# Patient Record
Sex: Female | Born: 1975 | Race: White | Hispanic: No | Smoking: Current every day smoker
Health system: Southern US, Community
[De-identification: ages and names within clinical notes are randomized; demographics above are authoritative.]

## PROBLEM LIST (undated history)

## (undated) DIAGNOSIS — T50901A Poisoning by unspecified drugs, medicaments and biological substances, accidental (unintentional), initial encounter: Secondary | ICD-10-CM

## (undated) DIAGNOSIS — N281 Cyst of kidney, acquired: Secondary | ICD-10-CM

## (undated) DIAGNOSIS — F329 Major depressive disorder, single episode, unspecified: Secondary | ICD-10-CM

## (undated) DIAGNOSIS — E079 Disorder of thyroid, unspecified: Secondary | ICD-10-CM

## (undated) DIAGNOSIS — F32A Depression, unspecified: Secondary | ICD-10-CM

## (undated) DIAGNOSIS — X838XXA Intentional self-harm by other specified means, initial encounter: Secondary | ICD-10-CM

## (undated) HISTORY — PX: ABDOMINAL HYSTERECTOMY: SHX81

## (undated) HISTORY — PX: OVARIAN CYST REMOVAL: SHX89

---

## 2001-06-10 ENCOUNTER — Other Ambulatory Visit: Admission: RE | Admit: 2001-06-10 | Discharge: 2001-06-10 | Payer: Self-pay | Admitting: *Deleted

## 2001-07-08 ENCOUNTER — Encounter: Payer: Self-pay | Admitting: Emergency Medicine

## 2001-07-08 ENCOUNTER — Emergency Department (HOSPITAL_COMMUNITY): Admission: EM | Admit: 2001-07-08 | Discharge: 2001-07-09 | Payer: Self-pay | Admitting: Emergency Medicine

## 2001-12-19 ENCOUNTER — Emergency Department (HOSPITAL_COMMUNITY): Admission: EM | Admit: 2001-12-19 | Discharge: 2001-12-19 | Payer: Self-pay | Admitting: Emergency Medicine

## 2002-04-17 ENCOUNTER — Encounter: Payer: Self-pay | Admitting: Emergency Medicine

## 2002-04-17 ENCOUNTER — Emergency Department (HOSPITAL_COMMUNITY): Admission: EM | Admit: 2002-04-17 | Discharge: 2002-04-17 | Payer: Self-pay | Admitting: Emergency Medicine

## 2002-06-04 ENCOUNTER — Emergency Department (HOSPITAL_COMMUNITY): Admission: EM | Admit: 2002-06-04 | Discharge: 2002-06-04 | Payer: Self-pay | Admitting: Emergency Medicine

## 2002-06-16 ENCOUNTER — Emergency Department (HOSPITAL_COMMUNITY): Admission: EM | Admit: 2002-06-16 | Discharge: 2002-06-16 | Payer: Self-pay | Admitting: Internal Medicine

## 2002-10-16 ENCOUNTER — Emergency Department (HOSPITAL_COMMUNITY): Admission: EM | Admit: 2002-10-16 | Discharge: 2002-10-16 | Payer: Self-pay | Admitting: Emergency Medicine

## 2002-12-23 ENCOUNTER — Emergency Department (HOSPITAL_COMMUNITY): Admission: EM | Admit: 2002-12-23 | Discharge: 2002-12-23 | Payer: Self-pay | Admitting: Emergency Medicine

## 2003-02-02 ENCOUNTER — Emergency Department (HOSPITAL_COMMUNITY): Admission: EM | Admit: 2003-02-02 | Discharge: 2003-02-02 | Payer: Self-pay | Admitting: Emergency Medicine

## 2003-06-15 ENCOUNTER — Emergency Department (HOSPITAL_COMMUNITY): Admission: EM | Admit: 2003-06-15 | Discharge: 2003-06-15 | Payer: Self-pay | Admitting: Emergency Medicine

## 2003-07-03 ENCOUNTER — Encounter: Payer: Self-pay | Admitting: *Deleted

## 2003-07-03 ENCOUNTER — Ambulatory Visit (HOSPITAL_COMMUNITY): Admission: RE | Admit: 2003-07-03 | Discharge: 2003-07-03 | Payer: Self-pay | Admitting: *Deleted

## 2003-10-25 ENCOUNTER — Emergency Department (HOSPITAL_COMMUNITY): Admission: EM | Admit: 2003-10-25 | Discharge: 2003-10-25 | Payer: Self-pay | Admitting: Emergency Medicine

## 2003-12-22 ENCOUNTER — Emergency Department (HOSPITAL_COMMUNITY): Admission: EM | Admit: 2003-12-22 | Discharge: 2003-12-22 | Payer: Self-pay | Admitting: Emergency Medicine

## 2004-10-04 ENCOUNTER — Observation Stay (HOSPITAL_COMMUNITY): Admission: RE | Admit: 2004-10-04 | Discharge: 2004-10-04 | Payer: Self-pay | Admitting: General Surgery

## 2004-10-23 ENCOUNTER — Ambulatory Visit (HOSPITAL_COMMUNITY): Admission: RE | Admit: 2004-10-23 | Discharge: 2004-10-23 | Payer: Self-pay | Admitting: *Deleted

## 2004-11-29 ENCOUNTER — Observation Stay (HOSPITAL_COMMUNITY): Admission: RE | Admit: 2004-11-29 | Discharge: 2004-11-30 | Payer: Self-pay | Admitting: *Deleted

## 2005-07-01 ENCOUNTER — Ambulatory Visit: Payer: Self-pay | Admitting: Internal Medicine

## 2005-07-01 ENCOUNTER — Inpatient Hospital Stay (HOSPITAL_COMMUNITY): Admission: EM | Admit: 2005-07-01 | Discharge: 2005-07-04 | Payer: Self-pay | Admitting: Emergency Medicine

## 2005-07-24 ENCOUNTER — Emergency Department (HOSPITAL_COMMUNITY): Admission: EM | Admit: 2005-07-24 | Discharge: 2005-07-25 | Payer: Self-pay | Admitting: Emergency Medicine

## 2006-02-04 ENCOUNTER — Emergency Department (HOSPITAL_COMMUNITY): Admission: EM | Admit: 2006-02-04 | Discharge: 2006-02-04 | Payer: Self-pay | Admitting: Emergency Medicine

## 2006-09-02 ENCOUNTER — Emergency Department (HOSPITAL_COMMUNITY): Admission: EM | Admit: 2006-09-02 | Discharge: 2006-09-02 | Payer: Self-pay | Admitting: Emergency Medicine

## 2006-10-26 ENCOUNTER — Emergency Department (HOSPITAL_COMMUNITY): Admission: EM | Admit: 2006-10-26 | Discharge: 2006-10-26 | Payer: Self-pay | Admitting: Emergency Medicine

## 2006-11-26 ENCOUNTER — Emergency Department (HOSPITAL_COMMUNITY): Admission: EM | Admit: 2006-11-26 | Discharge: 2006-11-26 | Payer: Self-pay | Admitting: Emergency Medicine

## 2007-01-24 ENCOUNTER — Ambulatory Visit (HOSPITAL_COMMUNITY): Admission: AD | Admit: 2007-01-24 | Discharge: 2007-01-24 | Payer: Self-pay | Admitting: Obstetrics & Gynecology

## 2007-05-05 ENCOUNTER — Inpatient Hospital Stay (HOSPITAL_COMMUNITY): Admission: AD | Admit: 2007-05-05 | Discharge: 2007-05-05 | Payer: Self-pay | Admitting: Obstetrics & Gynecology

## 2007-05-06 ENCOUNTER — Encounter: Payer: Self-pay | Admitting: Obstetrics & Gynecology

## 2007-05-06 ENCOUNTER — Inpatient Hospital Stay (HOSPITAL_COMMUNITY): Admission: RE | Admit: 2007-05-06 | Discharge: 2007-05-08 | Payer: Self-pay | Admitting: Obstetrics & Gynecology

## 2007-08-07 ENCOUNTER — Emergency Department (HOSPITAL_COMMUNITY): Admission: EM | Admit: 2007-08-07 | Discharge: 2007-08-07 | Payer: Self-pay | Admitting: Emergency Medicine

## 2007-08-09 ENCOUNTER — Ambulatory Visit (HOSPITAL_COMMUNITY): Admission: RE | Admit: 2007-08-09 | Discharge: 2007-08-09 | Payer: Self-pay | Admitting: Emergency Medicine

## 2007-08-09 ENCOUNTER — Inpatient Hospital Stay (HOSPITAL_COMMUNITY): Admission: EM | Admit: 2007-08-09 | Discharge: 2007-08-11 | Payer: Self-pay | Admitting: Emergency Medicine

## 2007-10-24 ENCOUNTER — Emergency Department (HOSPITAL_COMMUNITY): Admission: EM | Admit: 2007-10-24 | Discharge: 2007-10-24 | Payer: Self-pay | Admitting: Emergency Medicine

## 2008-04-10 ENCOUNTER — Ambulatory Visit (HOSPITAL_COMMUNITY): Admission: RE | Admit: 2008-04-10 | Discharge: 2008-04-10 | Payer: Self-pay | Admitting: Obstetrics and Gynecology

## 2008-06-23 ENCOUNTER — Encounter: Payer: Self-pay | Admitting: Obstetrics & Gynecology

## 2008-06-23 ENCOUNTER — Inpatient Hospital Stay (HOSPITAL_COMMUNITY): Admission: RE | Admit: 2008-06-23 | Discharge: 2008-06-26 | Payer: Self-pay | Admitting: Obstetrics & Gynecology

## 2008-06-30 ENCOUNTER — Encounter (HOSPITAL_COMMUNITY): Admission: RE | Admit: 2008-06-30 | Discharge: 2008-07-17 | Payer: Self-pay | Admitting: Obstetrics and Gynecology

## 2008-07-19 ENCOUNTER — Emergency Department (HOSPITAL_COMMUNITY): Admission: EM | Admit: 2008-07-19 | Discharge: 2008-07-19 | Payer: Self-pay | Admitting: Emergency Medicine

## 2009-01-09 ENCOUNTER — Emergency Department (HOSPITAL_COMMUNITY): Admission: EM | Admit: 2009-01-09 | Discharge: 2009-01-09 | Payer: Self-pay | Admitting: Emergency Medicine

## 2009-01-11 ENCOUNTER — Emergency Department (HOSPITAL_COMMUNITY): Admission: EM | Admit: 2009-01-11 | Discharge: 2009-01-11 | Payer: Self-pay | Admitting: Emergency Medicine

## 2009-02-09 ENCOUNTER — Emergency Department (HOSPITAL_COMMUNITY): Admission: EM | Admit: 2009-02-09 | Discharge: 2009-02-09 | Payer: Self-pay | Admitting: Emergency Medicine

## 2009-03-06 ENCOUNTER — Emergency Department: Payer: Self-pay

## 2009-04-28 ENCOUNTER — Emergency Department (HOSPITAL_COMMUNITY): Admission: EM | Admit: 2009-04-28 | Discharge: 2009-04-28 | Payer: Self-pay | Admitting: Emergency Medicine

## 2009-12-11 ENCOUNTER — Emergency Department (HOSPITAL_COMMUNITY): Admission: EM | Admit: 2009-12-11 | Discharge: 2009-12-11 | Payer: Self-pay | Admitting: Emergency Medicine

## 2010-03-25 ENCOUNTER — Emergency Department (HOSPITAL_COMMUNITY): Admission: EM | Admit: 2010-03-25 | Discharge: 2010-03-25 | Payer: Self-pay | Admitting: Emergency Medicine

## 2010-08-04 ENCOUNTER — Emergency Department (HOSPITAL_COMMUNITY): Admission: EM | Admit: 2010-08-04 | Discharge: 2010-08-04 | Payer: Self-pay | Admitting: Emergency Medicine

## 2010-08-31 ENCOUNTER — Emergency Department (HOSPITAL_COMMUNITY): Admission: EM | Admit: 2010-08-31 | Discharge: 2010-08-31 | Payer: Self-pay | Admitting: Emergency Medicine

## 2010-11-03 ENCOUNTER — Emergency Department (HOSPITAL_COMMUNITY)
Admission: EM | Admit: 2010-11-03 | Discharge: 2010-11-03 | Payer: Self-pay | Source: Home / Self Care | Admitting: Emergency Medicine

## 2010-11-04 LAB — URINALYSIS, ROUTINE W REFLEX MICROSCOPIC
Bilirubin Urine: NEGATIVE
Hgb urine dipstick: NEGATIVE
Ketones, ur: NEGATIVE mg/dL
Nitrite: NEGATIVE
Protein, ur: NEGATIVE mg/dL
Specific Gravity, Urine: 1.025 (ref 1.005–1.030)
Urine Glucose, Fasting: NEGATIVE mg/dL
Urobilinogen, UA: 0.2 mg/dL (ref 0.0–1.0)
pH: 5.5 (ref 5.0–8.0)

## 2010-11-10 ENCOUNTER — Encounter: Payer: Self-pay | Admitting: *Deleted

## 2010-11-10 ENCOUNTER — Encounter: Payer: Self-pay | Admitting: Obstetrics and Gynecology

## 2010-11-11 ENCOUNTER — Encounter: Payer: Self-pay | Admitting: Obstetrics and Gynecology

## 2010-12-25 ENCOUNTER — Other Ambulatory Visit (HOSPITAL_COMMUNITY): Payer: Self-pay | Admitting: Family Medicine

## 2010-12-25 ENCOUNTER — Ambulatory Visit (HOSPITAL_COMMUNITY)
Admission: RE | Admit: 2010-12-25 | Discharge: 2010-12-25 | Disposition: A | Discharge status: Home / Self Care | Payer: Medicaid Other | Source: Ambulatory Visit | Attending: Family Medicine | Admitting: Family Medicine

## 2010-12-25 DIAGNOSIS — M25519 Pain in unspecified shoulder: Secondary | ICD-10-CM | POA: Insufficient documentation

## 2010-12-25 DIAGNOSIS — R52 Pain, unspecified: Secondary | ICD-10-CM

## 2010-12-25 DIAGNOSIS — M545 Low back pain, unspecified: Secondary | ICD-10-CM | POA: Insufficient documentation

## 2010-12-31 LAB — URINE MICROSCOPIC-ADD ON

## 2010-12-31 LAB — URINALYSIS, ROUTINE W REFLEX MICROSCOPIC
Glucose, UA: NEGATIVE mg/dL
Ketones, ur: NEGATIVE mg/dL
pH: 8.5 — ABNORMAL HIGH (ref 5.0–8.0)

## 2011-01-01 LAB — URINALYSIS, ROUTINE W REFLEX MICROSCOPIC
Bilirubin Urine: NEGATIVE
Ketones, ur: NEGATIVE mg/dL
Nitrite: NEGATIVE
Specific Gravity, Urine: >1.03 — ABNORMAL HIGH (ref 1.005–1.030)
Urobilinogen, UA: 0.2 mg/dL (ref 0.0–1.0)

## 2011-01-01 LAB — URINE CULTURE
Colony Count: 75000
Culture  Setup Time: 201110172038

## 2011-01-01 LAB — URINE MICROSCOPIC-ADD ON

## 2011-01-26 LAB — URINE CULTURE

## 2011-01-26 LAB — URINALYSIS, ROUTINE W REFLEX MICROSCOPIC
Bilirubin Urine: NEGATIVE
Protein, ur: 100 mg/dL — AB
Urobilinogen, UA: 0.2 mg/dL (ref 0.0–1.0)

## 2011-03-04 ENCOUNTER — Emergency Department (HOSPITAL_COMMUNITY)
Admission: EM | Admit: 2011-03-04 | Discharge: 2011-03-04 | Disposition: A | Discharge status: Home / Self Care | Payer: Medicaid Other | Attending: Emergency Medicine | Admitting: Emergency Medicine

## 2011-03-04 DIAGNOSIS — M4120 Other idiopathic scoliosis, site unspecified: Secondary | ICD-10-CM | POA: Insufficient documentation

## 2011-03-04 DIAGNOSIS — W19XXXA Unspecified fall, initial encounter: Secondary | ICD-10-CM | POA: Insufficient documentation

## 2011-03-04 DIAGNOSIS — IMO0001 Reserved for inherently not codable concepts without codable children: Secondary | ICD-10-CM | POA: Insufficient documentation

## 2011-03-04 DIAGNOSIS — G2581 Restless legs syndrome: Secondary | ICD-10-CM | POA: Insufficient documentation

## 2011-03-04 DIAGNOSIS — Z79899 Other long term (current) drug therapy: Secondary | ICD-10-CM | POA: Insufficient documentation

## 2011-03-04 DIAGNOSIS — S335XXA Sprain of ligaments of lumbar spine, initial encounter: Secondary | ICD-10-CM | POA: Insufficient documentation

## 2011-03-04 DIAGNOSIS — F319 Bipolar disorder, unspecified: Secondary | ICD-10-CM | POA: Insufficient documentation

## 2011-03-04 NOTE — H&P (Signed)
[name], [name]                ACCOUNT NO.:  [id number]   MEDICAL RECORD NO.:  [id number]          PATIENT TYPE:  INP   LOCATION:  NA                            FACILITY:  [facility]   PHYSICIAN:  Lazaro Arms, M.D.   DATE OF BIRTH:  1976-10-17   DATE OF ADMISSION:  05/06/2007  DATE OF DISCHARGE:                              HISTORY & PHYSICAL   HISTORY OF PRESENT ILLNESS:  The patient is a 35 year old white female,  Gravida 3, para 1, abortus 1, estimated delivery of 05/08/07, currently  at 39 weeks and five days gestation, who is admitted for a repeat  cesarean section.  She had a cesarean section back in 1998 for toxemia.  She declines a trial of labor.  She had an elective abortion in 07/07.  The patient's pregnancy has been complicated by two issues, number one  she is a chronic narcotic uses, came into the pregnancy using  benzodiazepines, narcotics, had a positive drug screen on her initial  visit, positive for cocaine, marijuana, and benzodiazepines.  She has  subsequently had negative urine drug screens for cocaine.  I basically  bargained with her and she has been taking throughout the pregnancy, no  more than two Vicodin per day and she has stuck with this and on an  occasion or two she has been problematic but overall she has done well.   Additionally she has had two episodes of boles in her vulva and lower  abdomen and they were positive for methicillin resistant Staph. aureus.  The only thing that she could take in pregnancy and it was sensitive to  was Zyvox which she has had twice in the pregnancy, 600 mg twice a day.  She recently finished a course of that and her lesions have all dried  up.  Otherwise besides that the pregnancy has been otherwise  uncomplicated.  She is admitted for a repeat cesarean section.   PAST MEDICAL HISTORY:  History of drug abuse, narcotic abuse, and  tobacco abuse.   PAST SURGICAL HISTORY:  Cesarean section in 1998, had an ovarian cyst  surgery, and she states surgery for adhesions times two.   ALLERGIES:  Allergies are to sulfa drugs which cause itching and  burning.   CURRENT MEDICATIONS:  Her current medications are prenatal vitamins and  Vicodin, she takes two a day.  She recently finished a course of Zyvox  for positive methicillin resistant Staph. aureus.   REVIEW OF SYSTEMS:  The review of systems is otherwise negative.   PHYSICAL EXAMINATION:  HEENT:  Unremarkable.  NECK:  The thyroid is normal.  LUNGS:  The lungs are clear.  HEART:  The heart is regular rhythm without regurgitation or gallop.  BREASTS:  Deferred.  ABDOMEN:  Gravid, fundal height of 36 cm, cervix is fingertip, thick,  and high, vertex.   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  Pap was ascus and colpo was negative.  GC  and  chlamydia are negative times two.  AFP was normal.  Group B strep  was negative.  Glucola was normal.   IMPRESSION:  1. Intrauterine pregnancy at 49 and 5/7ths weeks gestation.  2. Previous cesarean section.  3. Declines trial of labor.  4. Positive methicillin resistant Staph. aureus.  5. History of narcotic abuse and cocaine abuse.   PLAN:  The patient is admitted for a repeat cesarean section.  She  understands the risks, benefits, indications, and alternatives, will  proceed.      Lazaro Arms, M.D.  Electronically Signed     LHE/MEDQ  D:  05/05/2007  T:  05/05/2007  Job:  [id number]   cc:   Lazaro Arms, M.D.  Fax: [phone]

## 2011-03-04 NOTE — Op Note (Signed)
[name], [name]                ACCOUNT NO.:  [id number]   MEDICAL RECORD NO.:  [id number]          PATIENT TYPE:  INP   LOCATION:  9103                          FACILITY:  [facility]   PHYSICIAN:  Lazaro Arms, M.D.   DATE OF BIRTH:  11/24/1975   DATE OF PROCEDURE:  05/06/2007  DATE OF DISCHARGE:                               OPERATIVE REPORT   PREOPERATIVE DIAGNOSES:  1. Intrauterine pregnancy at 56 and 5/7th weeks gestation.  2. Previous cesarean section.  3. Declines trial of labor.  4. History of narcotic dependence.   POSTOPERATIVE DIAGNOSES:  1. Intrauterine pregnancy at 34 and 5/7th weeks gestation.  2. Previous cesarean section.  3. Declines trial of labor.  4. History of narcotic dependence.   PROCEDURE:  Repeat cesarean section.   SURGEON:  Lazaro Arms, MD.   ANESTHESIA:  Spinal.   FINDINGS:  Over a low transverse hysterotomy incision was delivered a  viable female infant with Apgar's of 9 and 9 with weight determined in  the nursery.  There was a three-vessel cord.  Cord blood and cord gas  were sent.  Placenta was normal and intact and sent to Pathology as well  routinely.  Uterus, tubes, and ovaries were all normal.   DESCRIPTION OF OPERATION:  The patient was taken to the operating room  and underwent a spinal anesthetic.  She was prepped and draped in the  usual sterile fashion, and a Foley catheter was placed.  A Pfannenstiel  skin incision was made, carried down sharply to the rectus fascia,  scored in the midline, and extended laterally.  The fascia was taken off  the muscles superiorly and inferiorly without difficulty, the muscles  were divided, the peritoneal cavity was entered.  A large Alexis wound  retractor was placed as a self-retaining retractor, the bladder flap was  created.  A low transverse hysterotomy incision was made and over this  incision was delivered a viable female infant with Apgar's of 9 and 9  with the weight to be determined in  the nursery.  There was a three-  vessel cord.  Cord blood and cord gases were sent.  The placenta was  delivered spontaneously.  The uterus was exteriorized, closed in two  layers, the first being a running interlocking layer, the second being  an imbricating layer that was also locked.  There was good hemostasis.  The uterus was replaced into the peritoneal cavity, both pericolic  gutters were irrigated, all pedicles were found to be hemostatic.  The  muscles and peritoneum reapproximated loosely, the fascia closed using  #0 Vicryl running, the subcutaneous tissue was made hemostatic and  irrigated, and  the skin was closed with skin staples.  The patient tolerated the  procedure well.  She experienced 500 ml of blood loss and was taken to  Recovery in good, stable condition.  All counts were correct x3.  She  received Cleocin prophylactically due to a history of MRSA.      Lazaro Arms, M.D.  Electronically Signed     LHE/MEDQ  D:  05/06/2007  T:  05/06/2007  Job:  [id number]

## 2011-03-04 NOTE — Discharge Summary (Signed)
NAMEKATHLEN, [name]                ACCOUNT NO.:  [id number]   MEDICAL RECORD NO.:  [id number]          PATIENT TYPE:  INP   LOCATION:  A332                          FACILITY:  [facility]   PHYSICIAN:  Marcello Moores, MD   DATE OF BIRTH:  October 11, 1976   DATE OF ADMISSION:  08/09/2007  DATE OF DISCHARGE:  10/22/2008LH                               DISCHARGE SUMMARY   PRIMARY MEDICAL DOCTOR:  Unassigned.   DISCHARGING DIAGNOSES:  1. Urinary tract infection, questionable pyelonephritis, resolving.  2. Right and left ovarian cyst.  She will have follow-up with      gynecologist Dr. Emelda Fear in the coming 2 to 3 days.   HOME MEDICATIONS:  She will be discharged with:  1. Levaquin 500 mg p.o. daily for 7 days.  2. Vicodin 1 tablet p.o. every 6 hours p.r.n.  3. Xanax 0.25 mg twice a day.  4. Cymbalta 60 mg p.o. daily   HOSPITAL COURSE:  Ms. [name] is a 35 year old female patient who came  with abdominal pain more in the lower part of the abdomen and right side  involving the lower back area, and she had urinalysis positive for  urinary tract infection, and she has slight leukocytosis, and she had  low-grade fever, and she was admitted with urinary tract infection,  possible pyelonephritis.  Urine culture as well as blood culture showed  negative, and ultrasound of the pelvic area also done and showed right  ovarian cyst  2 x 9 x 1.9 x 1.7 cm and left ovarian cyst 1.2 x 1.2 x 1.5  cm, and the patient was put on Levaquin and pain medication, and her  pain slightly improved, and she is urging to go home today, and she is  stable except she has mild pain on the left lower side of the abdomen.  CAT scan of the abdomen was also done, and it showed no active pelvic  findings except right ovarian cyst, and the patient is going to be  discharged with the Levaquin, and she was advised to come back if there  is any concern, any pain, any fever, and she was advised to have follow-  up with gynecologist  and will try to have appointment for her while she  was here before she was discharged, and she agreed that she will see Dr.  Emelda Fear in the coming 2 to 3 days for her ovarian cyst as the pain  could be also from the ovarian cyst.   Today, her vital signs are:  Temperature is 97.9 and pulse is 78,  respiratory rate 20, blood pressure is 105/63, and she has pink  conjunctivae, nonicteric sclera.  NECK:  Is supple.  CHEST:  Good air entry.  CARDIOVASCULAR SYSTEM:  S1/S2 regular.  ABDOMEN:  Soft.  Normoactive bowel sounds.  Slight tenderness on the  right lower quadrant.  No guarding, no rigidity.  EXTREMITIES:  No pedal edema.  CENTRAL NERVOUS SYSTEM:  She is alert and well oriented.   LABORATORY DATA:  Today, CBC is 11.3, hemoglobin is 10.5, hematocrit 31  and platelet count is 306.  On the chemistry, sodium is 141, potassium  is 3.5, chloride is 107, bicarb 27, glucose is 105, BUN 8, creatinine  0.7, and urine culture as well as blood culture is negative.   PLAN ON DISCHARGE:  Will be discharged with Levaquin and Vicodin for her  pain, and she will be followed with gynecologist in the coming 2 to 3  days, and she and her husband understood and were willing to do so.      Marcello Moores, MD  Electronically Signed     MT/MEDQ  D:  08/11/2007  T:  08/12/2007  Job:  [id number]

## 2011-03-04 NOTE — Op Note (Signed)
[name], [name]                ACCOUNT NO.:  [id number]   MEDICAL RECORD NO.:  [id number]          PATIENT TYPE:  INP   LOCATION:  9104                          FACILITY:  [facility]   PHYSICIAN:  Lazaro Arms, M.D.   DATE OF BIRTH:  1976/08/24   DATE OF PROCEDURE:  06/23/2008  DATE OF DISCHARGE:                               OPERATIVE REPORT   PREOPERATIVE DIAGNOSES:  1. Term intrauterine pregnancy.  2. Undesired fertility.  3. History of prior cesarean section.   POSTOPERATIVE DIAGNOSES:  1. Term intrauterine pregnancy.  2. Undesired fertility.  3. History of prior cesarean section.   PROCEDURE:  1. Repeat low-transverse cesarean section.  2. Bilateral tubal ligation using modified Pomeroy method.   SURGEON:  Lazaro Arms, MD   ASSISTANT:  Odie Sera, DO   ANESTHESIA:  Spinal anesthesia.   INDICATIONS FOR PROCEDURE:  Mrs. [name] is a 35 year old  multigravida patient at term with a prior cesarean section who desires  repeat cesarean section.  Additionally, she has requested a bilateral  tubal ligation.  She has been counseled the risks and benefits of both  of these procedures to include, but not limited to bleeding, infection,  injury to surrounding organs, a risk of failure of about 5-7 in 1000 of  tubal ligation, as well as increased risk of ectopic pregnancy in the  event that a pregnancy would occur.  The patient is aware of these risks  as well as her alternatives, forms of birth control, and desires to  proceed with both procedures.   DESCRIPTION OF PROCEDURE:  The patient was taken to the operating room  and spinal anesthesia was placed.  The patient was placed in the left  dorsal supine position and prepped and draped in the usual sterile  manner.  A time-out was conducted and appropriate anesthesia was  confirmed.  A low-transverse Pfannenstiel incision was made and the skin  with a scalpel.  The incision was extended down to the fascia with  electrocautery.  The fascia was incised in the midline with  electrocautery, and dissected off the underlying fascia and rectus  muscles bluntly and then sharply in the midline with scissors.  The  rectus muscles were spread and the peritoneum was entered bluntly. The  peritoneal entrance was extended with manual traction.  A bladder flap  was created and a bladder blade was placed.  A transverse incision was  made in the lower uterine segment with the scalpel.  The small opening  was extended with manual traction.  The membranes were ruptured and  clear amniotic fluid was noted.  The baby's head was elevated out of the  pelvis and delivered with the assistance of fundal pressure.  The baby  was bulb suctioned after delivery of the head and the shoulders and rest  of the corpus were delivered in the usual fashion.  The cord was clamped  and cut x2, and the infant was handed to the NICU staff with a  spontaneous cry.  The uterine incision was closed with 0 Monocryl suture  in a running interlocking  fashion.  The rectus muscles were loosely  approximated with chromic gut.  Attention was then brought to the  fascial incision which was closed with 0 Vicryl in a running non-  interlocking fashion.  Hemostasis was confirmed and no defects were  noted.  The incision in the subcutaneous layers were gently irrigated  and hemostasis was confirmed.  The skin was closed with staples, and a  pressure dressing was placed.   FINDINGS:  1. Clear amniotic fluid.  2. Viable female infant at term.  3. Grossly normal uterus, tubes, and ovaries.   SPECIMENS:  1. Intact placenta with three-vessel cord.  2. Bilateral fallopian tubes.   DISPOSITION:  Specimens to pathology.   ESTIMATED BLOOD LOSS:  500 mL.   COMPLICATIONS:  None immediate.   The patient was taken to PACU in good condition.      Odie Sera, DO  Electronically Signed     ______________________________  Lazaro Arms,  M.D.    MC/MEDQ  D:  06/23/2008  T:  06/24/2008  Job:  [id number]

## 2011-03-04 NOTE — Discharge Summary (Signed)
[name], [name]                ACCOUNT NO.:  [id number]   MEDICAL RECORD NO.:  [id number]          PATIENT TYPE:  INP   LOCATION:  9103                          FACILITY:  [facility]   PHYSICIAN:  [name], M.D.   DATE OF BIRTH:  01/18/1976   DATE OF ADMISSION:  05/06/2007  DATE OF DISCHARGE:  05/08/2007                               DISCHARGE SUMMARY   REASON FOR ADMISSION:  [name] was admitted for a repeat low transverse  cesarean section on May 06, 2007.   HOSPITAL COURSE:  She underwent that without complications.  Her  postpartum course has been uncomplicated.  She has remained afebrile.  She has actually done quite well with her pain management given her  history of narcotic abuse.  The baby did test positive for narcotics and  benzodiazepines which she does have a prescription for the narcotics but  not the benzodiazepines.  Social services is handling this.   PHYSICAL EXAMINATION:  HEENT:  Within normal limits.  HEART:  Regular rate and rhythm.  LUNGS:  Clear to auscultation bilaterally.  ABDOMEN:  Soft and nontender.  Fundus is firm at U-2.  Her incision is  clean and dry without erythema or drainage.  The staples are intact.  She is voiding without difficulty and passing flatus.  She has bowel  sounds in all four quadrants and legs are negative.   IMPRESSION:  Postoperative day #2 status post repeat cesarean section.  The patient desires discharge.   PLAN:  We will discharge home with her to call our office on Monday to  make an appointment for Wednesday or Thursday for staple removal.      [name], C.N.M.      [name], M.D.  Electronically Signed    FC/MEDQ  D:  05/08/2007  T:  05/08/2007  Job:  [id number]   cc:   [facility]

## 2011-03-04 NOTE — H&P (Signed)
NAMEGARRY, BOCHICCHIO                ACCOUNT NO.:  [id number]   MEDICAL RECORD NO.:  [id number]          PATIENT TYPE:  INP   LOCATION:  A332                          FACILITY:  [facility]   PHYSICIAN:  Dorris Singh, DO    DATE OF BIRTH:  01-Apr-1976   DATE OF ADMISSION:  08/09/2007  DATE OF DISCHARGE:  LH                              HISTORY & PHYSICAL   The patient is a 35 year old female that presented to [facility] with complaint of abdominal pain.  She had been  seen previously and was concerned that her abdominal pain could be from  an ovarian cyst that she has been told she had.  She is postpartum by  three months, has not had any complications with birth.  She stated that  abdominal pain started three days ago which was acute and had multiple  episodes of it.  Also she stated it was radiating to the right flank,  also to the suprapubic region.  She also had nausea and vomiting, fever  and chills with this as well.  Patient currently is not breast-feeding.  She is bottle-feeding her newborn.  Her last normal period was August 18, 2006, and she does state that she has regular periods.   ALLERGIES:  SULFA which includes burning.   CURRENT MEDICATIONS:  Currently she is only on Vicodin oral and an  antibiotic for a UTI but does not know the name.   PAST MEDICAL HISTORY:  Significant for  1. Genital herpes.  2. MRSA.  3. Ovarian cysts.  4. UTI.  5. Toxemia of pregnancy.   PAST SURGICAL HISTORY:  1. C-section.  2. Two ovarian cysts removed.   SOCIAL HISTORY:  Nondrinker but currently smokes.  Also states she use  to use drugs, cocaine, as well.  Denies any travel anywhere at this  point in time.  She is a P2, G1, abortus 1.   REVIEW OF SYSTEMS:  GENERAL:  Positive for decreased appetite and  chills, fatigue and sweats.  EYES:  Negative for eye pain, diplopia or  discharge.  ENMT:  Negative for hearing loss, ear pain, hoarseness, nose  pain,  difficulty smelling or sinus pressure or sore throat.  CARDIAC:  Negative for chest pain, palpitations or bradycardia.  RESPIRATIONS:  Negative for cough, dyspnea or wheezing.  GASTROINTESTINAL:  Positive  for abdominal pain, cramps, nausea and vomiting.  GENITOURINARY:  Negative for dysuria but positive for suprapubic tenderness and flank  pain.  MUSCULOSKELETAL:  Negative for arthralgias or arthritis or back  pain.  SKIN:  Negative for rashes, abrasions or blistering.  NEUROLOGIC:  Negative for headache, confusion or altered mental status.  PSYCHIATRIC:  Positive for anxiety.   PHYSICAL EXAMINATION:  VITAL SIGNS:  Temperature 99.4, pulse 107,  respirations 20, blood pressure 104/66.  GENERAL APPEARANCE:  This is a 35 year old Caucasian female who is well-  developed, well-nourished, in no acute distress.  SKIN:  Good turgor, good texture, positive tattoos.  HEENT:  Head normocephalic and atraumatic.  Eyes:  PERRLA, EOMI.  No  conjunctival injection or scleral  icterus.  Nose symmetrical, no  turbinate inflammation.  Ears:  Gross auditory acuity intact.  Mouth:  Poor dental hygiene.  No erythema or exudate noted.  NECK:  Supple.  Full range of motion.  LUNGS:  Clear to auscultation bilaterally.  No wheezing, rhonchi or  rales.  CARDIOVASCULAR:  Regular rate and rhythm, no murmurs, rubs, gallops, or  clicks.  ABDOMEN:  Positive tenderness on the right upper and lower quadrant.  No  guarding noted.  Positive CVA tenderness.  MUSCULOSKELETAL:  Negative atrophy.  Negative weakness.  Full range of  motion.  No swelling, redness or tenderness noted.  NEUROLOGIC:  Cranial nerves II-XII grossly intact.  Alert and oriented  x3.   LABORATORY DATA:  Her white count was 14.9 which is decreased to 11.3,  hemoglobin 10.5, hematocrit 31.1, platelet count 306.  Sodium 141,  potassium 3.5, chloride 107, CO2 27, glucose 105, BUN 8, creatinine  0.78.  Her blood culture shows no growth at this point in  time.   ASSESSMENT/PLAN:  1. Right-sided pyelonephritis.  2. Hypokalemia which has been corrected.   PLAN:  Will go ahead and admit patient to the service of [facility].  Patient was given Levaquin p.o. in the emergency department.  Will go  ahead and continue with IV medication and do deep venous thrombosis and  gastrointestinal prophylaxis.  Patient has a lot of chronic pain.  Will  continue to try to keep pain under management as well as continue  antibiotic therapy.  Anticipate discharge in one day as long as patient  continues to improve and white count continues to decrease at this point  in time.      Dorris Singh, DO  Electronically Signed     CB/MEDQ  D:  08/10/2007  T:  08/10/2007  Job:  [id number]

## 2011-03-07 NOTE — Op Note (Signed)
[name], [name]                ACCOUNT NO.:  [id number]   MEDICAL RECORD NO.:  [id number]          PATIENT TYPE:  OBV   LOCATION:  A419                          FACILITY:  [facility]   PHYSICIAN:  Langley Gauss, MD     DATE OF BIRTH:  01/16/1976   DATE OF PROCEDURE:  11/29/2004  DATE OF DISCHARGE:  11/30/2004                                 OPERATIVE REPORT   PREOPERATIVE DIAGNOSES:  1.  Bilateral pelvic pain; left lower quadrant pain and right lower quadrant      pain with the left lower quadrant pain being greater than the right.  2.  Menorrhagia.  3.  Dysmenorrhea.  4.  Deep penetration dyspareunia.  5.  History of adhesive disease of the pelvis.   POSTOPERATIVE DIAGNOSES:  1.  Bilateral pelvic pain; left lower quadrant pain and right lower quadrant      pain with the left lower quadrant pain being greater than the right.   OPERATION PERFORMED:  1.  Laparoscopy with;  2.  Right ovarian cystectomy with portions of cyst wall for permanent      specimen; the impression is that of a corpus albicans cyst.  3.  Lysis of adhesions particularly in the left adnexa.  4.  Lysis of omental adhesion to the anterior abdominal all.   FINDINGS:  The findings at the time of surgery include a diffusely enlarged  nodular uterus consistent with probably adenomyosis as well as small mucosal  fibroid.  Corpus albicans cyst within the right ovary.  The left adnexa is  noted to have adhesive disease between the left fallopian tube as well as  the large bowel to the left pelvic sidewall.  The fimbria are to be in  excellent condition bilaterally.  Tubal patency was confirmed by  insufflation of methylene blue dye via a cannula placed in the cervix.   SPECIMENS:  Portions of the right ovarian cyst wall for permanent section  only.   ESTIMATED BLOOD LOSS:  The estimated blood loss was less than 100 mL.   COMPLICATIONS:  None.   ANESTHESIA:  General endotracheal.   DESCRIPTION OF OPERATION:  The  patient was brought to the operating room  and vital signs were stable.  The patient underwent  uncomplicated induction  of general anes.  She was placed in the dorsal lithotomy position, prepped  and draped in the usual sterile manner.  Foley catheter was sterilely placed  to straight drainage with the findings of clear yellow urine.   The speculum was placed.  The cervix was visualized.  The cervix was noted  to be without lesions.  The anterior lip of the cervix was visualized and  grasped with  single-tooth tenaculum.  An Personal assistant was then passed  through the endocervix and into the lower uterine segment.  This was then  fixated to the previously placed single-tooth tenaculum.  The speculum was  then removed.  We then changed to sterile gloves.  Starting at the patient's  left side a 1 cm vertical incision was made just inferior to the umbilicus.  Likewise a  suprapubic a suprapubic transverse incision was made above the  hairline.  The abdominal wall was then elevated.  The Veress needle was then  passed through the subumbilical incision angling  perpendicular to the  fascial plan.  This was done atraumatically.  A drop test was then confirmed  a proper intraperitoneal placement.   A pneumoperitoneum was then created by insufflating 3.3 liters of CO2 gas  with a filling pressure of less than 15 mmHg.  The Veress needle was then  removed.  The abdominal wall was again elevated.  Bony landmarks were  palpated.  The 10 mm disposable trocar and sleeve connected to the camera,  and then inserted under direct visualization through the subumbilical  incision and going towards a hollow sacrum.  This was done atraumatically.  Trocar was removed.  The pelvic cavity was visualized.  There was no  apparent bowel or vascular injury upon entering into the pelvis.  CO2 gas  was now connected to the 10 mm sleeve.   Under direct visualization a 5 mm trocar and sleeve were inserted   suprapubically in the midline.  Likewise to the left of the midline near the  level of the umbilicus s third 5 mm trocar and sleeve were inserted under  direct visualization.   The patient was now placed in the Trendelenburg position.  The pelvis was as  previously described.  Additionally there was prior C-section through a  midline abdominal incision.  There was noted to be some fixation of the  bladder flap to the anterior abdominal wall.  Likewise the omental adhesions  to the midline incision near the level of the umbilicus.  Utilizing blunt  dissection then I was able to free up the omental adhesions to the anterior  abdominal wall.  The right ovary was visualized.  It was grasped with an  alligator clamp.  Gentle manipulation was performed, which resulted in  efflux of clear follicular fluid followed by exudation of ovarian cyst wall,  which was then removed in a piecemeal fashion.  The base of the right  ovarian cyst was then cauterized with a J hook unipolar cautery.  The right  fallopian tube was otherwise noted to  be normal in appearance.  The uterus  was a described previously.  The cul-de-sac was well visualized.  There  appeared to be an endometriotic implant near the left uterosacral ligament.  No free fluid was identified within the pelvis.  No active infection or  inflammation is noted to be present.   The uterus was then deflected posterior and efforts were made, utilizing  blunt dissection, to mobilize some of the bladder flap off the anterior  lower uterine segment at the level of the previous uterine incision.  This  was done without causing any excessive bleeding.  The left fallopian tube  was then confirmed by tracing it down to its fimbriated end. The left  fallopian tube, left ovary and large colon were noted to be adherent to the  left pelvic sidewall.  Blunt dissection was then utilized with the alligator clamps in the avascular plane to completely free up this  adhesive process.  This resulted in a normal-appearing left fallopian tube, which was freely  mobile; and, likewise the left ovary is normal in appearance and freely  mobile.  Manipulation of the left ovary revealed that there was no cystic or  solid lesions identified.  The large bowel on the left was likewise noted to  be normal in appearance,  but is now freed from its attachments to the left  pelvic sidewall.   Copious irrigation was then performed until clear.  There was no active  bleeding noted to be occurring.  The uterus was as described previously.   After the assurance of adequate hemostasis and irrigation until clear being  performed about 200 mL of sterile normal saline was left within the pelvic  cavity.  All instruments were removed and the gas was allowed to escape.  Our sponge, needle and instrument counts are correct at this point.  Of  note, just prior to removal of the instruments a very dilute methylene blue  solution was injected through the intrauterine cannula and bilateral spill  was noted from the fallopian tubes bilaterally.  We the gas now removed the  three incision sites were closed using 0-Vicryl suture through the deep  fascial layer.  The three skin incisions were then completely closed  utilizing skin staples. A total of 10 mL of 0.5% bupivacaine plain was then  injected along the skin incisions through small skin wheels.  Sterile  dressings were applied over the three incisions.   Examination of the genital area now reveals that there is some moderate  descensus of the cervix and the uterus with gentle traction.  The tenaculum  was removed as well as the Personal assistant.  The patient was taken out of  the dorsal lithotomy position.  Anesthesia was reversed.   The patient was taken to the recovery room in stable condition at which time  the operative findings were discussed with the patient and her awaiting  family.      DC/MEDQ  D:  11/30/2004   T:  12/02/2004  Job:  [id number]

## 2011-03-07 NOTE — Op Note (Signed)
[name], [name]                ACCOUNT NO.:  [id number]   MEDICAL RECORD NO.:  [id number]          PATIENT TYPE:  AMB   LOCATION:  DAY                           FACILITY:  [facility]   PHYSICIAN:  Leroy C. Katrinka Blazing, M.D.   DATE OF BIRTH:  [birth year]   DATE OF PROCEDURE:  10/04/2004  DATE OF DISCHARGE:                                 OPERATIVE REPORT   PREOPERATIVE DIAGNOSIS:  Chronic pelvic pain.   POSTOPERATIVE DIAGNOSIS:  Resolving left salpingitis.   PROCEDURE:  Diagnostic laparoscopy.   SURGEON:  Dirk Dress. Katrinka Blazing, M.D.   DESCRIPTION:  Under general anesthesia, the patient's abdomen was prepped  and draped in a sterile field.  A supraumbilical incision was made and a  Veress needle was inserted uneventfully.  The abdomen was insufflated with  2.5 L of CO2.  Using a Visiport guide, a 10 mm port was placed.  The patient  was placed deep Trendelenburg position.  Under videoscopic guidance, a  suprapubic 5 mm port was placed under direct videoscopic guidance.  A Dorsey  clamp was used to manipulate the pelvic organs.  On direct visualization,  there was evidence of inflammation of the right tube and ovary.  Most of the  inflammation was in the right tube.  The right tube was stuck up above the  uterus in between the uterus and the bladder.  It was associated with a  small inclusion cyst.  The end of the tube was fibrotic and the distal tip  appeared to be partially obstructed.  It appeared that she had had  significant inflammation in this tube.  In the left side, there was  inflammation of the distal tube, but the distal tip and the fimbriae were  delicate and there appeared to be a good opening.  There were no adhesions  in this area.  The uterus was gravid, but the uterus was enlarged and was  slightly nodular, suggesting adenomyosis intramurally. There was no evidence  of fibroids.  There was no inflammation between the bladder and anterior  aspect of the uterus, and there was no  evidence of inflammation in the  posterior aspect of the uterus and the rectum.  There was a very small  amount of fluid in the cul-de-sac.  The rectum appeared to be normal.  There  were no adhesions to the rectum and distal sigmoid.  The appendix appeared  to be normal, and there was no inflammation or thickening.  The distal cecum  and ascending colon were normal.  The small bowel in the pelvis was normal  without any evidence of adhesions.  The patient tolerated the procedure  well.  There few adhesions to the right tube and ovary were lysed with  electrocautery.  No other abnormality could be found.  The patient tolerated  the procedure well.  Good photos of the pelvic structures were taken for  later verification.  CO2 was allowed to escape from the abdomen and the  ports were removed.  The incisions were closed using 0 Dexon on the fascia  of the supraumbilical incision and staples  on the skin of both incisions.  OpSite dressings were placed.  The patient tolerated the procedure well.  She was awakened from anesthesia uneventfully, transferred to a bed, and  taken to the postanesthetic care unit for further monitoring.    Lero  LCS/MEDQ  D:  10/04/2004  T:  10/04/2004  Job:  [id number]

## 2011-03-07 NOTE — H&P (Signed)
[name], [name]                ACCOUNT NO.:  [id number]   MEDICAL RECORD NO.:  [id number]          PATIENT TYPE:  AMB   LOCATION:  DAY                           FACILITY:  [facility]   PHYSICIAN:  Langley Gauss, MD     DATE OF BIRTH:  02-Aug-1976   DATE OF ADMISSION:  11/29/2004  DATE OF DISCHARGE:  LH                                HISTORY & PHYSICAL   HISTORY OF PRESENT ILLNESS:  The patient is a 35 year old white female noted  to be gravida 1, para 1, one prior low transverse cesarean section on  August 30, 1997.  The patient is referred through Outpatient Surgical Unit  for a laparoscopic investigation of the pelvis.  For complete details of  history, see that of note dictated previously, dated Mar 07, 2004.  Currently, the patient does have chronic bilateral adnexal pain which  worsens during her menses.  Has been several years duration, and more  specifically now increasing frequency and intensity over the past year.  The  patient declines trial of oral contraceptives for pain relief.  She also  experiences deep penetration dyspareunia, menorrhagia, and dysmenorrhea.  Most pertintely she has undergone laparoscopic investigation of the pelvis.  Operative report dated October 04, 2004.  At that time, the right tube was  noted to be stuck up above the uterus, in between the uterus and the  bladder, small inclusion cyst noted, end of the tube was fibrotic, distal  tip appeared to be partially obstructed, findings which would be descriptive  of tubal occlusive with hydrosalpinx resulting in pain and pressure in the  right adnexal region.  Enlarged nodular uterus was also identified.  The  patient had been informed following the procedure that the operating  physician felt unprepared to completely free up the adhesions or relief the  pressure in the obstructed fallopian tube with resultant incomplete  performance of the surgical procedure.  The patient states that she has had  no  relief of her pain since that procedure was performed, and wishes to  proceed with investigation of the pelvis by a physician trained and  credentialed to perform tubal and advanced adnexal surgery.  Expected  findings at time of surgery would be that of adhesive disease with  hydrosalpinx, relief of the pressure may result in significant improvement  in the patient's overall clinical status.  She is currently requiring  narcotic medication for pain relief, taking either Lorcet or Tylox for pain  relief.  At time of her visit here, the patient described herself as taking:   MEDICATIONS:  1.  Xanax.  2.  Acyclovir.  3.  Lorcet p.r.n.   ALLERGIES:  No known drug allergies.   Previously, the patient had been taking Wellbutrin 150 mg p.o. b.i.d., Xanax  1 mg p.o. t.i.d., Vicodin Extra Strength one p.o. q.i.d., Mobic 15 mg p.o.  daily, and Enablex 7.5 mg p.o. daily.   PAST MEDICAL HISTORY:  1.  The patient does have previous diagnosis of bipolar disorder.  2.  She does have one prior low transverse cesarean section.   Past  history is negative for hypertension, diabetes.   REVIEW OF SYSTEMS:  Negative for any GI problems.   PHYSICAL EXAMINATION:  GENERAL:  The patient is obviously a very heavy  cigarette smoker.  VITAL SIGNS:  Weight is 137.5, pulse of 80, respiratory rate is 20, blood  pressure is 108/63.  HEENT:  Negative.  No adenopathy.  NECK:  Supple, thyroid is nonpalpable.  LUNGS:  Clear.  CARDIOVASCULAR:  Regular rate and rhythm.  ABDOMEN:  Soft and nontender.  Midline incision is noted from prior cesarean  section.  The patient is mildly tender in the suprapubic as well as the  adnexal regions bilaterally.  PELVIC:  Normal external genitalia, no lesions or ulcerations identified.  Sterile speculum examination:  Cervix is without lesions.  Bimanual  examination:  Uterus is multiparous in size, slightly fixed in position,  anteflexed.  The adnexa are bilaterally palpably  normal, but very tender to  any significant manipulation.   LABORATORY DATA:  Outpatient radiological studies:  A pelvic ultrasound  performed at [facility] dated October 23, 2004, for a chief  complaint of left lower quadrant pain reveals uterus measuring 9.5 cm  maximum length, no fibroids are identified, endometrial stripe 11 mm in  thickness, right ovary normal in appearance, left ovary does have an 18 mm  dominant follicular or collapsing left ovary cyst.   ASSESSMENT:  The patient with one prior low transverse cesarean section.  She is noted to have documented adhesive disease on prior laparoscopic  procedure which was probably incompletely lysed at that time.   PLAN:  Proceed with diagnostic laparoscopy.  At that time, an Acorn  tenaculum can be inserted into the uterus, gentle pressure may result of  breaking up of the fibrotic closed end of the fallopian tubes.  This  alleviation of the pressure may in of itself provide significant relief to  the patient.  Risks and benefits of the procedure discussed with the patient  to include risks associated with the general anesthetic, in addition, risks  of infection or hemorrhage are also discussed.  This will be performed on  November 29, 2004.      DC/MEDQ  D:  11/28/2004  T:  11/28/2004  Job:  [id number]

## 2011-03-07 NOTE — H&P (Signed)
NAMEKALLEY, NICHOLL                ACCOUNT NO.:  [id number]   MEDICAL RECORD NO.:  [id number]          PATIENT TYPE:  AMB   LOCATION:  DAY                           FACILITY:  [facility]   PHYSICIAN:  Leroy C. Katrinka Blazing, M.D.   DATE OF BIRTH:  1976/01/30   DATE OF ADMISSION:  DATE OF DISCHARGE:  LH                                HISTORY & PHYSICAL   HISTORY OF PRESENT ILLNESS:  This is a 35 year old female with a history of  recurrent chronic pelvic pain with increasing dyspareunia.  She has been  followed for this for about 5 months.  She had ultrasound of the pelvis done  in September, which only showed a 1 cm right paraovarian cyst.  She has been  treated with analgesics without improvement.  Because of increasing severe  pain unresponsive to treatment, the patient is scheduled for diagnostic  laparoscopy with most of the attention focused on the pelvis.   PAST HISTORY:  1.  She has a history of a bipolar disorder.  2.  Gastroesophageal reflux disease.  3.  Osteoarthritis.   SURGERY:  Cesarean section.   MEDICATIONS:  1.  Wellbutrin XL 150 mg b.i.d.  2.  Xanax 1 mg t.i.d.  3.  Vicodin ES one q.i.d.  4.  Mobic 15 mg daily.  5.  Enablex 7.5 mg daily.   REVIEW OF SYSTEMS:  Positive for recurrent nocturia, urinary stress  incontinence, increasing urinary urgency.  She has increased mood swings and  chronic pain in her legs, hips, and back.   PHYSICAL EXAMINATION:  VITAL SIGNS:  Blood pressure 100/64, pulse 88,  respirations 18, weight 132 pounds.  HEENT:  Unremarkable.  NECK:  Supple.  No JVD, bruits, adenopathy, or thyromegaly.  CHEST:  Clear to auscultation.  HEART:  Regular rate and rhythm without murmur, gallop, or rub.  ABDOMEN:  Moderate left lower quadrant and right lower quadrant tenderness  from the suprapubic tenderness.  Normoactive bowel sounds.  EXTREMITIES:  Diffuse tenderness of both shoulders with decreased range of  motion.  BACK:  Moderate lumbar tenderness with  negative straight leg raising test.  NEUROLOGIC:  No focal motor, sensory, or cerebellar deficits.   IMPRESSION:  1.  Chronic pelvic pain of uncertain etiology, possibly functionally in      origin.  Must rule out pathology etiology.  2.  Bipolar disorder.  3.  Osteoarthritis.  4.  Hyperactive bladder syndrome.  5.  Osteoarthritis.   PLAN:  The patient will have diagnostic laparoscopy with evaluation of the  lower abdomen and pelvis.     Lero   LCS/MEDQ  D:  10/03/2004  T:  10/03/2004  Job:  [id number]   cc:   [facility]

## 2011-03-07 NOTE — H&P (Signed)
NAME:  [name], Holzheimer NO.:  [id number]   MEDICAL RECORD NO.:  [id number]                  PATIENT TYPE:   LOCATION:                                       FACILITY:   PHYSICIAN:  Langley Gauss, M.D.                DATE OF BIRTH:   DATE OF ADMISSION:  DATE OF DISCHARGE:                                HISTORY & PHYSICAL   PROCEDURES:  Diagnostic laparoscopy with probable lysis of adhesions.   HISTORY OF PRESENT ILLNESS:  The patient is a 35 year old gravida 1, para 1.  She has one prior low transverse cesarean section August 30, 1997.  Operative procedure without complications.  Comes in the office with the  chief complaint of ovarian pain on both sides.  This has been worsening  during the past 6 months, particularly during the past 3 months,  specifically, the patient complains of the onset of bilateral adnexal pain  which she states begins about mid cycle.  It is constant in nature with  frequent sharp exacerbations.  The pain begins about mid cycle, increases  during the second half of her cycles and is worse just at time of menses.  The pain is noted to abate somewhat following completion of he menses, such  as she has about one week time where she is nearly pain free.  During this  time, she also experiences deep penetration of dyspareunia  She feels some  pelvic bloating, but most pertinently does describe the pain as being  bilateral in it's nature.   PERTINENT REVIEW OF SYSTEMS:  Negative GI.  Specifically, no nausea,  vomiting, diarrhea.  No history of constipation.  No findings which would  suggest irritable bowel symptoms.   The patient's menstrual periods are described as being typically on a  monthly basis with anywhere from 28-33 day cycles, coming at fairly regular  intervals.  She does have PMS symptoms with bloating and fluid retention.   ALLERGIES:  DOXYCYCLINE.   CURRENT MEDICATIONS:  1. Xanax 1 mg q.day to b.i.d.  2.  Acyclovir suppressive therapy 400 mg p.o. daily.  On this regimen she     does not have any recurrent outbreaks.  3. Additionally, discussed with her on today's date of visit is a urine drug     screen performed November 01, 2003, which was positive for     benzodiazepines, cocaine, marijuana and opiates.  As could be expected,     she adamantly denies knowledge of any cocaine usage.  She is counseled     that this certainly is deleterious to her current medical condition and     is part of the problem she is experiencing with current pelvic pain.  4. She is not currently on any birth control.  She denies use of any birth     control since she had her previous C-section.   SOCIAL HISTORY:  Problematic in  that she currently smokes two packs per day.  In addition, she now offers a history that she is taking Wellbutrin in an  effort to stop smoking.  Her current sexual partner is named Kathryne Hitch.  He is not the father of the first child, but does have a 16-year-  old and a 23-year-old daughter.   PAST MEDICAL HISTORY:  1. Primary low transverse cesarean section, August 30, 1997.  Operative     procedure and postoperative course without complications.  The patient     was noted to have pregnancy induced hypertension.  2. Denies history of hypertension.  3. Denies diabetes.  4. Denies any significant GI problems.  5. She was noted to have a normal glucose tolerance test during pregnancy at     117.   PHYSICAL EXAMINATION:  GENERAL APPEARANCE:  Heavy cigarette smoker.  VITAL SIGNS:  Weight 127, pulse 85, blood pressure 110/69.  HEENT:  Negative.  No adenopathy.  Poor dentition.  NECK:  Supple.  LUNGS:  Clear.  CARDIOVASCULAR:  Regular rate and rhythm.  ABDOMEN:  Soft, mildly protuberant, poor muscular tone.  No pelvic masses  are appreciated.  She does have previous midline abdominal incision from  prior C-section.  She does experience pain with manipulation of the adnexal   areas bilaterally.  EXTREMITIES:  Within normal limits, only trace edema.  PELVIC:  Normal external genitalia.  Bladder is well supported as well as  the bladder neck.  Cervix is noted to be without lesions.  Bimanual  examination:  Uterus noted to be multiparus in size, slightly fixed in  position.  The adnexa are noted to be palpably normal, but tender to  manipulation bilaterally.   LABORATORY DATA:  Previous ultrasound dated June 22, 2003, revealed 1 cm  right prior ovarian cyst.  Uterus was slightly enlarged measuring maximum  length of 10.3 cm.  No free fluids identified within the pelvis.  There was  noted to be a small amount of debris or fluid in the uterine canal.  The  ovaries, otherwise, unremarkable.  Pelvic ultrasound is requested at [facility] to look for any obvious adnexal pathology.   ASSESSMENT:  Patient with chronic bilateral adnexal pelvic pain, worsening  during menses, several years duration, worsening during the past six months,  particularly during the past 3 months.  The patient is requiring narcotic  medication for pain relief at this time.  She declines trial or oral  contraceptives for pain alleviation.  Following her repeat ultrasound at  [facility], we discussed with her proceeding with __________  evaluation of the pelvis and adnexa.     ___________________________________________                                         Langley Gauss, M.D.   DC/MEDQ  D:  03/07/2004  T:  03/07/2004  Job:  [id number]

## 2011-03-07 NOTE — H&P (Signed)
[name], [name]                ACCOUNT NO.:  [id number]   MEDICAL RECORD NO.:  [id number]          PATIENT TYPE:  INP   LOCATION:  A318                          FACILITY:  [facility]   PHYSICIAN:  Kirk Ruths, M.D.DATE OF BIRTH:  [birth year]   DATE OF ADMISSION:  07/01/2005  DATE OF DISCHARGE:  LH                                HISTORY & PHYSICAL   CHIEF COMPLAINT:  Abdominal pain and burning all over.   HISTORY OF PRESENT ILLNESS:  This is 35 year old female who states she had  been having some progressive lower abdominal pain over the last couple of  days, but denies any diarrhea, constipation, nausea or vomiting.  The  patient also states she approximately at midnight, began having burning  sensation all over.  She said it might be related to a recent antibiotic she  was taking for urinary tract infection.  The patient did not know the name  of the antibiotic.  The patient also incidentally had undergone two  surgeries in the last year for lysis of adhesions and ovarian cyst, one by  Dr. Elpidio Anis and one by Dr. Langley Gauss.  The patient has been on  chronic narcotic pain medications for abdominal pain since.  She also takes  Xanax and is unsure of her other medications.  In the emergency room, she  was unresponsive to Benadryl, Ativan, Toradol, Atarax and Dilaudid.  The  patient's CBC and MET 7 are within normal range as well as her urinalysis  and her CT of the abdomen and pelvis.  Due to her previous surgical problems  and her unresponsiveness of symptoms to above-mentioned medications, she is  admitted for further evaluation.   PAST MEDICAL HISTORY:  Bipolar disorder.   CURRENT MEDICATIONS:  We are unsure of her present medications.   ALLERGIES:  No known drug allergies.   SOCIAL HISTORY:  She is a smoker with alcohol abuse.  She tested positive  for multiple legal drugs on her last admission.   REVIEW OF SYSTEMS:  CARDIOPULMONARY:  Denies chest pain, shortness  of  breath.  GASTROINTESTINAL:  Denies nausea, vomiting, diarrhea or fever.   PHYSICAL EXAMINATION:  GENERAL:  A middle-age female who has somewhat  slurred speech.  VITAL SIGNS:  Afebrile.  Blood pressure 130/75, pulse 90 and regular,  respirations 20 and unlabored.  HEENT:  TMs normal.  Pupils equal round and reactive to light and  accommodation.  Oropharynx benign.  NECK:  Supple without JVD, bruit or thyromegaly.  LUNGS:  Clear in all areas of heart.  CARDIAC:  Regular rate and rhythm without murmurs, rubs or gallops.  ABDOMEN:  Soft, positive bowel sounds.  Mild diffuse lower abdominal  tenderness, no rebound.  EXTREMITIES:  Without clubbing, cyanosis or edema.  NEUROLOGIC:  Grossly intact.   ASSESSMENT:  1.  Abdominal pain.  2.  Pruritus, burning of skin.  3.  History of bipolar disorder.  4.  History of multiple abdominal surgeries secondary to adhesions and      ovarian cyst.      Kirk Ruths, M.D.  Electronically  Signed     WMM/MEDQ  D:  07/01/2005  T:  07/01/2005  Job:  [id number]

## 2011-03-07 NOTE — Consult Note (Signed)
[name], [name]                ACCOUNT NO.:  [id number]   MEDICAL RECORD NO.:  [id number]          PATIENT TYPE:  INP   LOCATION:  A318                          FACILITY:  [facility]   PHYSICIAN:  Lionel December, M.D.    DATE OF BIRTH:  January 24, 1976   DATE OF CONSULTATION:  07/01/2005  DATE OF DISCHARGE:                                   CONSULTATION   DATE OF CONSULTATION:  July 01, 2005.   REASON FOR CONSULTATION:  Abdominal pain.   REQUESTING PHYSICIAN:  Dr. Yetta Numbers.   HISTORY OF PRESENT ILLNESS:  The patient is a 35 year old Caucasian female,  a polysubstance abuser, who is admitted with abdominal pain and complaints  of pain all over (burning and itching).  She presented to the emergency  department today after two hours of intractable diffuse burning and itching.  She noted that she had a rash.  She has been on antibiotics for a urinary  tract infection.  Initially, she took Cipro, but, because of persistent  infection, she was switched to another antibiotic a few days ago, of which  she cannot recall the name.  She denies any nausea or vomiting.  Her bowel  movements have been regular.  Her last bowel movement was last night.  No  hematemesis and no hematochezia.  She describes intermittent abdominal pain  primarily of the lower abdomen.  She has had this chronically.  The pain  started again a couple of days ago.  She describes it as sharp and constant  at this point.  She states she is having pain 8/10 on a scale.  Her last  menstrual cycle was on May 26, 2005.  Lately, her cycles have been regular  and normal.  Denies any back pain, heartburn, dysuria or hematuria.  She  admits to marijuana use within the last 24 hours.  She denies any other  drugs, even though I told her that her urine drug screen is positive.   CT of the abdomen done in the emergency department revealed bilateral  ovarian cysts.  The appendix was high was possible fecaliths but no evidence  of  appendicitis.  She has apparent cystic lesions in the small bowel  mesentery, and a contrasted CT of the pelvis has been recommended.  MET-7  and CBC normal.  Urine pregnancy test negative.  Urine drug screen positive  for amphetamines, cocaine, opiates and marijuana.  Urinalysis revealed trace  protein, hyaline casts and rare bacteria.   MEDICATIONS PRIOR TO ADMISSION:  Detrol LA, Cymbalta, Tylox, Xanax and an  antibiotic.   ALLERGIES:  No known drug allergies.   PAST MEDICAL HISTORY:  1.  Recent urinary tract infection.  Has completed a course of Cipro and now      on a second antibiotic.  2.  History of chronic intermittent pelvic pain.  In December 2005, she had      a diagnostic laparoscopy by Dr. Elpidio Anis which revealed a resolving      left salpingitis.  In February 2006, Dr. Lisette Grinder did a right ovarian  cystectomy and lysis of adhesions in the left adnexa.  3.  She has a history of polysubstance abuse with prior positive urine drug      screens for cocaine, marijuana, benzos and opiates as well as current      urine drug screen as above.  4.  Bipolar disorder.  5.  Overactive bladder.  6.  Caesarean section in 1998.   FAMILY HISTORY:  Negative for cancer or liver disease.   SOCIAL HISTORY:  She has a daughter.  She smokes a half a pack of cigarettes  daily.  Denies any alcohol use.  Initially, denied any drug use, but upon  further questioning, she admitted to marijuana use within the last 24 hours.   REVIEW OF SYSTEMS:  See HPI for GI and GU.   PHYSICAL EXAMINATION:  VITAL SIGNS:  Temp 97.5, pulse 94, respirations 20,  blood pressure 138/67.  GENERAL:  A pleasant young Caucasian female who appears to be in some  distress.  She is walking around in her room, hunched over and complaining  of abdominal pain.  SKIN:  Warm and dry.  No jaundice.  HEENT:  Conjunctivae are pink.  Sclerae are anicteric.  Oropharyngeal mucosa  moist and pink.  No lymphadenopathy.   CHEST:  Lungs are clear to auscultation.  CARDIAC:  Reveals regular rate and rhythm with normal S1 and S2.  No  murmurs, rubs or gallops.  ABDOMEN:  Positive bowel sounds.  The abdomen is very soft and nondistended.  She has mild to moderate tenderness throughout the lower abdomen to deep  palpation.  No rebound tenderness or guarding.  No abdominal bruits or  hernias.  EXTREMITIES:  No edema.  SKIN:  Along her back, chest, abdomen and lower extremities, she has a very  fine macular rash.   LABORATORY:  White count 5300, hemoglobin 13.6, hematocrit 39.4, platelets  270,000.  Sodium 135, potassium 3.5, BUN 6, creatinine 0.8, glucose 97.   IMPRESSION:  1.  The patient is a 35 year old Caucasian female with lower abdominal pain      in the setting of polysubstance abuse.  She has had two diagnostic      laparoscopies in the last 12 months as outlined above.  CT preliminary      findings noted as above.  Etiology of pain unclear at this time.  We      need to review films, and she will probably need to have an oral and IV      contrast CT of the pelvis.  2.  Rash, possibly related to the second antibiotic of which she cannot      recall the name.   RECOMMENDATIONS:  1.  Review CT films.  We will make a decision regarding CT of the pelvis.  2.  LFTs.  3.  Determine the name of antibiotic that she has been on.      Tana Coast, P.A.      Lionel December, M.D.  Electronically Signed    LL/MEDQ  D:  07/01/2005  T:  07/01/2005  Job:  [id number]   cc:   Kirk Ruths, M.D.  [address]  [place]  [place] [address]  Fax: [phone]

## 2011-03-07 NOTE — Discharge Summary (Signed)
NAMEGRAYSON, WHITE                ACCOUNT NO.:  [id number]   MEDICAL RECORD NO.:  [id number]          PATIENT TYPE:  INP   LOCATION:  9104                          FACILITY:  [facility]   PHYSICIAN:  Lazaro Arms, M.D.   DATE OF BIRTH:  08/30/1976   DATE OF ADMISSION:  06/23/2008  DATE OF DISCHARGE:  06/26/2008                               DISCHARGE SUMMARY   DISCHARGE DIAGNOSES:  1. Status post repeat cesarean section and tubal ligation.  2. Unremarkable postoperative course.  3. Desired sterilization.   Please refer to the history and physical, antepartum chart, and the  operative report for details from admission to the hospital.   HOSPITAL COURSE:  The patient is admitted postoperatively from a C-  section and tubal ligation.  Her postop course is unremarkable.  She  tolerated clear liquids and regular diet without symptoms and was  ambulatory.  Her abdominal exam was benign.  Her incision was clean,  dry, and intact.  Her postop hemoglobin and hematocrit was 8.9 and 0.6  on postop day #1 and #2, her incision was clean, dry, and intact.  She  was discharged home on the morning of postop day #3 in good stable  condition and to follow the office next week to have her incision  evaluated and staples removed.  She was given instruction precautions  for term prior to that time.  Lazaro Arms, M.D.  Electronically Signed     LHE/MEDQ  D:  08/01/2008  T:  08/02/2008  Job:  [id number]

## 2011-03-07 NOTE — Discharge Summary (Signed)
[name], [name]                ACCOUNT NO.:  [id number]   MEDICAL RECORD NO.:  [id number]          PATIENT TYPE:  INP   LOCATION:  A318                          FACILITY:  [facility]   PHYSICIAN:  Kirk Ruths, M.D.DATE OF BIRTH:  02-26-1976   DATE OF ADMISSION:  07/01/2005  DATE OF DISCHARGE:  09/15/2006LH                                 DISCHARGE SUMMARY   DISCHARGE DIAGNOSES:  1.  Abdominal pain.  2.  Ovarian cyst.  3.  Illegal drug abuse.  4.  History of fibromyalgia.   HOSPITAL COURSE:  This 35 year old female was admitted to the emergency room  complaining of abdominal pain and burning all over.  The patient had  undergone two surgeries in the last year for lysis of adhesions and ovarian  cysts.  In the emergency room, the patient had received multiple doses of  Benadryl, Ativan, Toradol, Atarax, Dilaudid and was still have pain the  abdomen and burning.  The patient was afebrile with normal vitals on  admission.  She underwent CT of the abdomen and pelvis on admission which  were basically normal except for bilateral ovarian cysts.  Her white count  on admission was 5000, hemoglobin 13.6.  Sed rate was 18 and normal.  Met-7  and electrolytes were normal as well as her liver function tests.  Serum  pregnancy test was negative.  Patient's drug screen was positive for  amphetamines, cocaine, opiates and marijuana.  Urinalysis, otherwise, was  unremarkable.  The patient had intractable pain, although her abdomen  throughout her stay was also soft and essentially nontender.  She was seen  in consultation by GI who repeated CT of the abdomen and pelvis.  These,  again, were normal.  Basically, GI cleared the patient from GI standpoint.  She was seen in consultation by Dr. Despina Hidden for gynecology.  Ultrasound of the  pelvis was performed as well as a transvaginal ultrasound showing one simple  ovarian cyst and one slightly complicated ovarian cyst.  At the time of this  dictation,  Dr. Despina Hidden had not made a final determination on patient, but it is  anticipated that this is not the source of her pains.  She will be  discharged home today on her previous medications which include Tylox,  Xanax, Cymbalta, Detrol LA.  Will be followed at the office in one week.      Kirk Ruths, M.D.  Electronically Signed     WMM/MEDQ  D:  07/04/2005  T:  07/04/2005  Job:  [id number]

## 2011-03-07 NOTE — Discharge Summary (Signed)
NAMEALAJIAH, [name]                ACCOUNT NO.:  [id number]   MEDICAL RECORD NO.:  [id number]          PATIENT TYPE:  OBV   LOCATION:  A419                          FACILITY:  [facility]   PHYSICIAN:  Langley Gauss, MD     DATE OF BIRTH:  12/21/1975   DATE OF ADMISSION:  11/29/2004  DATE OF DISCHARGE:  02/11/2006LH                                 DISCHARGE SUMMARY   OPERATIVE PROCEDURE PERFORMED:  Right ovarian cystectomy and lysis of  adhesions within the left adnexa.   DISPOSITION:  The patient does have staples in the incisions.  She is  advised to follow up in the office in five days' time for staple removal for  the three incision sites.   DISCHARGE MEDICATIONS:  Tylox #30 for pain relief.   PERTINENT LABORATORY STUDIES:  Admission hemoglobin and hematocrit 13.1/37.9  with a white count of 8.3.  Liver function tests as well as electrolytes  within normal limits.  Pertinently, a urine drug screen performed  preoperatively, November 27, 2004, was positive for [facility] only.  However, upon  presentation for the operative procedure on November 29, 2004, the patient  appeared to have the presence of some white residue in her nasal passages.  Thus, immediately following the procedure, a urine drug screen was repeated.  The urine drug screen, repeated and performed November 29, 2004, was  positive for cocaine, positive for THC, positive for opiates and positive  for benzodiazepines.   Postoperatively, the patient was treated initially with IV Buprenex followed  by p.o. Tylox.  In addition, she was restarted on Xanax 1 mg p.o. t.i.d.  The patient did well on this regimen.  She ambulated.  Vital signs remained  stable.  She did have Foley catheter removed on the p.m. of November 29, 2004, after which time she was fully ambulatory and able to void without  difficulty.  Vital signs remained stable.  She did have resumption of bowel  function such that, on November 30, 2004, the patient is  passing gas.  The  abdomen is slightly bloated due to a combination of gas used for  insufflation as well as about 200 cc of sterile normal saline left within  the pelvic cavity in an effort to prevent postoperative adhesion formation.  Pertinently, extensive time was spent with the patient on November 30, 2004,  discussing, with her, her positive drug test.  As could be expected in this  category of patient, the patient adamantly declined any cocaine usage, nor  was she aware of any cocaine which any of her friends might have passed  along to her in any other controlled substances.  She was gracious enough to  admit that occasionally, she did smoke some marijuana.  Of course, this was  only on a very limited basis.  She, herself, stated she never bought  marijuana and utilized it for medicinal purposes of leg pain.  I did advise  the patient that she did test positive for cocaine and that whatever  behavior she was currently engaged in resulting in the positive cocaine  testing should  be ceased and desisted.  It will be very important when the  patient comes for a follow-up visit that she sign the prescription drug  contract, or, if already it has been done, this will be reviewed with the  patient and the implication of a positive testing for cocaine.  The patient  was discharged to home on November 30, 2004, in stable condition.  Pertinently, I was notified that prior to the patient leaving the hospital,  while waiting for a ride, it was found that the  patient was carrying around a straw in her panties, which, she states, she  used in an effort to avoid cigarette smoking.  She was heard by myself and  by the nursing staff to be inappropriately loud and hostile in her room at  0500 while on the phone with likely her current sexual partner.      DC/MEDQ  D:  12/03/2004  T:  12/03/2004  Job:  [id number]

## 2011-06-17 ENCOUNTER — Emergency Department (HOSPITAL_COMMUNITY)
Admission: EM | Admit: 2011-06-17 | Discharge: 2011-06-17 | Disposition: A | Discharge status: Home / Self Care | Payer: Medicaid Other | Attending: Emergency Medicine | Admitting: Emergency Medicine

## 2011-06-17 ENCOUNTER — Emergency Department (HOSPITAL_COMMUNITY): Payer: Medicaid Other

## 2011-06-17 ENCOUNTER — Encounter: Payer: Self-pay | Admitting: Emergency Medicine

## 2011-06-17 DIAGNOSIS — M549 Dorsalgia, unspecified: Secondary | ICD-10-CM | POA: Insufficient documentation

## 2011-06-17 DIAGNOSIS — F17200 Nicotine dependence, unspecified, uncomplicated: Secondary | ICD-10-CM | POA: Insufficient documentation

## 2011-06-17 DIAGNOSIS — Y92009 Unspecified place in unspecified non-institutional (private) residence as the place of occurrence of the external cause: Secondary | ICD-10-CM | POA: Insufficient documentation

## 2011-06-17 DIAGNOSIS — F319 Bipolar disorder, unspecified: Secondary | ICD-10-CM | POA: Insufficient documentation

## 2011-06-17 DIAGNOSIS — M542 Cervicalgia: Secondary | ICD-10-CM | POA: Insufficient documentation

## 2011-06-17 HISTORY — DX: Major depressive disorder, single episode, unspecified: F32.9

## 2011-06-17 HISTORY — DX: Depression, unspecified: F32.A

## 2011-06-17 MED ORDER — KETOROLAC TROMETHAMINE 60 MG/2ML IM SOLN
60.0000 mg | Freq: Once | INTRAMUSCULAR | Status: AC
  Administered 2011-06-17: 60 mg via INTRAMUSCULAR
  Filled 2011-06-17 (×2): qty 2

## 2011-06-17 MED ORDER — CYCLOBENZAPRINE HCL 10 MG PO TABS: 10.0000 mg | ORAL_TABLET | Freq: Three times a day (TID) | ORAL | Status: AC | PRN

## 2011-06-17 MED ORDER — CYCLOBENZAPRINE HCL 10 MG PO TABS
10.0000 mg | ORAL_TABLET | Freq: Once | ORAL | Status: AC
  Administered 2011-06-17: 10 mg via ORAL
  Filled 2011-06-17: qty 1

## 2011-06-17 NOTE — ED Notes (Signed)
Pt stated that she did not want the Toradol that was ordered for her; Pt stated "it does not do anything for me, I need something else";  I told pt I would let the doctor know; Dr. Lynelle Doctor informed pt refused Toradol, Dr. Lynelle Doctor stated toradol works best for her kind of pain and that she would not be receiving anything else at this time for her pain; I informed pt of Dr. Delford Field order and pt stated she would try the toradol but she was not happy because she said it burns; I administered the Toradol and pt stated she wants another nurse; I told pt I was her nurse and why did she not want me to continue being her nurse, she stated it was because I would not give her "what she wanted"; I then told the pt I could not give her what she wanted, I had to give her what the doctor had ordered for her; Charge RN notified and pt assigned to a new nurse

## 2011-06-17 NOTE — ED Notes (Signed)
c-collar removed as per Dr Lynelle Doctor verbal order, pt able to move her head in all directions without  hesitation of movement or grimacing. No neurologic deficits noted or stated by patient
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
## 2011-06-17 NOTE — ED Notes (Signed)
Pt refusing Toradol, pt states it does not do anything for her; Dr. Lynelle Doctor informed and stated that is all she will be receiving for pain at this time
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
## 2011-06-17 NOTE — ED Notes (Signed)
Pt self ambulated ut with a steady gait stating no needs

## 2011-07-03 ENCOUNTER — Emergency Department (HOSPITAL_COMMUNITY)
Admission: EM | Admit: 2011-07-03 | Discharge: 2011-07-03 | Disposition: A | Discharge status: Home / Self Care | Payer: No Typology Code available for payment source | Attending: Emergency Medicine | Admitting: Emergency Medicine

## 2011-07-03 ENCOUNTER — Emergency Department (HOSPITAL_COMMUNITY): Payer: No Typology Code available for payment source

## 2011-07-03 ENCOUNTER — Encounter (HOSPITAL_COMMUNITY): Payer: Self-pay | Admitting: *Deleted

## 2011-07-03 DIAGNOSIS — S63509A Unspecified sprain of unspecified wrist, initial encounter: Secondary | ICD-10-CM

## 2011-07-03 DIAGNOSIS — F17200 Nicotine dependence, unspecified, uncomplicated: Secondary | ICD-10-CM | POA: Insufficient documentation

## 2011-07-03 DIAGNOSIS — T148XXA Other injury of unspecified body region, initial encounter: Secondary | ICD-10-CM

## 2011-07-03 DIAGNOSIS — S8392XA Sprain of unspecified site of left knee, initial encounter: Secondary | ICD-10-CM

## 2011-07-03 DIAGNOSIS — IMO0002 Reserved for concepts with insufficient information to code with codable children: Secondary | ICD-10-CM | POA: Insufficient documentation

## 2011-07-03 MED ORDER — METHOCARBAMOL 500 MG PO TABS
1000.0000 mg | ORAL_TABLET | Freq: Once | ORAL | Status: AC
  Administered 2011-07-03: 1000 mg via ORAL
  Filled 2011-07-03: qty 2

## 2011-07-03 MED ORDER — METHOCARBAMOL 750 MG PO TABS: ORAL_TABLET | ORAL | Status: DC

## 2011-07-03 MED ORDER — IBUPROFEN 800 MG PO TABS
800.0000 mg | ORAL_TABLET | Freq: Once | ORAL | Status: AC
  Administered 2011-07-03: 800 mg via ORAL
  Filled 2011-07-03: qty 1
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
## 2011-07-03 NOTE — ED Provider Notes (Signed)
Medical screening examination/treatment/procedure(s) were performed by non-physician practitioner and as supervising physician I was immediately available for consultation/collaboration.   Averie Hornbaker M Nyx Keady, DO 07/03/11 2236 
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
## 2011-07-12 ENCOUNTER — Encounter (HOSPITAL_COMMUNITY): Payer: Self-pay

## 2011-07-12 ENCOUNTER — Emergency Department (HOSPITAL_COMMUNITY): Payer: Medicaid Other

## 2011-07-12 ENCOUNTER — Emergency Department (HOSPITAL_COMMUNITY)
Admission: EM | Admit: 2011-07-12 | Discharge: 2011-07-12 | Disposition: A | Discharge status: Home / Self Care | Payer: Medicaid Other | Attending: Emergency Medicine | Admitting: Emergency Medicine

## 2011-07-12 DIAGNOSIS — R1032 Left lower quadrant pain: Secondary | ICD-10-CM | POA: Insufficient documentation

## 2011-07-12 DIAGNOSIS — F17200 Nicotine dependence, unspecified, uncomplicated: Secondary | ICD-10-CM | POA: Insufficient documentation

## 2011-07-12 DIAGNOSIS — Z9079 Acquired absence of other genital organ(s): Secondary | ICD-10-CM | POA: Insufficient documentation

## 2011-07-12 DIAGNOSIS — R1031 Right lower quadrant pain: Secondary | ICD-10-CM | POA: Insufficient documentation

## 2011-07-12 LAB — DIFFERENTIAL
Band Neutrophils: 2 % (ref 0–10)
Blasts: 0 %
Lymphocytes Relative: 38 % (ref 12–46)
Lymphs Abs: 3 10*3/uL (ref 0.7–4.0)
Monocytes Absolute: 0.6 10*3/uL (ref 0.1–1.0)
Monocytes Relative: 8 % (ref 3–12)
nRBC: 1 /100 WBC — ABNORMAL HIGH

## 2011-07-12 LAB — COMPREHENSIVE METABOLIC PANEL
BUN: 9 mg/dL (ref 6–23)
CO2: 26 mEq/L (ref 19–32)
Calcium: 8.9 mg/dL (ref 8.4–10.5)
Creatinine, Ser: 0.72 mg/dL (ref 0.50–1.10)
GFR calc Af Amer: >60 mL/min (ref >60)
GFR calc non Af Amer: >60 mL/min (ref >60)
Glucose, Bld: 105 mg/dL — ABNORMAL HIGH (ref 70–99)

## 2011-07-12 LAB — TYPE AND SCREEN: Antibody Screen: NEGATIVE

## 2011-07-12 LAB — CBC
HCT: 23.8 % — ABNORMAL LOW (ref 36.0–46.0)
RDW: 14.2 % (ref 11.5–15.5)
WBC: 7.8 10*3/uL (ref 4.0–10.5)

## 2011-07-12 LAB — URINALYSIS, ROUTINE W REFLEX MICROSCOPIC
Bilirubin Urine: NEGATIVE
Ketones, ur: NEGATIVE mg/dL
Nitrite: NEGATIVE
Urobilinogen, UA: 0.2 mg/dL (ref 0.0–1.0)

## 2011-07-12 MED ORDER — SODIUM CHLORIDE 0.9 % IV BOLUS (SEPSIS)
1000.0000 mL | Freq: Once | INTRAVENOUS | Status: AC
  Administered 2011-07-12: 1000 mL via INTRAVENOUS

## 2011-07-12 MED ORDER — ONDANSETRON HCL 4 MG/2ML IJ SOLN
4.0000 mg | Freq: Once | INTRAMUSCULAR | Status: DC
  Filled 2011-07-12: qty 2

## 2011-07-12 MED ORDER — OXYCODONE-ACETAMINOPHEN 5-325 MG PO TABS
1.0000 | ORAL_TABLET | Freq: Once | ORAL | Status: AC
  Administered 2011-07-12: 1 via ORAL
  Filled 2011-07-12: qty 1

## 2011-07-12 MED ORDER — SODIUM CHLORIDE 0.9 % IV SOLN
Freq: Once | INTRAVENOUS | Status: AC
  Administered 2011-07-12: 21:00:00 via INTRAVENOUS

## 2011-07-12 MED ORDER — MORPHINE SULFATE 4 MG/ML IJ SOLN
INTRAMUSCULAR | Status: AC
  Administered 2011-07-12: 4 mg
  Filled 2011-07-12: qty 1

## 2011-07-12 MED ORDER — IOHEXOL 300 MG/ML  SOLN
100.0000 mL | Freq: Once | INTRAMUSCULAR | Status: AC | PRN
  Administered 2011-07-12: 100 mL via INTRAVENOUS

## 2011-07-12 NOTE — ED Provider Notes (Signed)
History    Scribed for Marissa Lennert, MD, [name] was seen in room APA16A/APA16A. This chart was scribed by [name]. This [name]'s care was started at 7:33 PM.     CSN: [id number] Arrival date & time: 07/12/2011  7:04 PM  Chief Complaint  [name] presents with  . Abdominal Pain    HPI  (Consider location/radiation/quality/duration/timing/severity/associated sxs/prior treatment)  [name] is a 35 y.o. female presenting with abdominal pain.  Abdominal Pain Marissa primary symptoms of Marissa illness include abdominal pain (RLQ, LLQ) and nausea. Marissa primary symptoms of Marissa illness do not include fatigue or diarrhea. Fever: feverish   Symptoms associated with Marissa illness do not include hematuria, frequency or back pain.    [name] is a 35 y.o. female who presents to [facility] complaining of gradual worsening of moderate RLQ and LLQ abdominal pain with associated purple colored bruising, abdominal edema, nausea and soreness that worsened last night.  [name] states that Marissa pain began after having a full hysterectomy (surgical) on 07/07/11.  Hysterectomy was performed at [facility] and [name] was hospitalized after surgery.   [name] has not contacted Dr. Ralph [name] regarding current sx.  Dr. Ernestina [name] OB GYN   PAST MEDICAL HISTORY:  Past Medical History  Diagnosis Date  . Depression     PAST SURGICAL HISTORY:  Past Surgical History  Procedure Date  . Ovarian cyst removal   . Abdominal hysterectomy     FAMILY HISTORY:  History reviewed. No pertinent family history.   SOCIAL HISTORY: History   Social History  . Marital Status: Widowed    Spouse Name: N/A    Number of Children: N/A  . Years of Education: N/A   Social History Main Topics  . Smoking status: Current Everyday Smoker -- 1.0 packs/day    Types: Cigarettes  . Smokeless tobacco: None  . Alcohol Use: No  . Drug Use: No  . Sexually Active: Yes    Birth Control/ Protection:  Surgical   Other Topics Concern  . None   Social History Narrative  . None     Review of Systems  Review of Systems  Constitutional: Negative for fatigue. Fever: feverish   HENT: Negative for congestion, sinus pressure and ear discharge.   Eyes: Negative for discharge.  Respiratory: Negative for cough.   Cardiovascular: Negative for chest pain.  Gastrointestinal: Positive for nausea and abdominal pain (RLQ, LLQ). Negative for diarrhea.  Genitourinary: Negative for frequency and hematuria.  Musculoskeletal: Negative for back pain.  Skin: Positive for color change (lower abdomen ). Negative for rash.  Neurological: Negative for seizures and headaches.  Hematological: Negative.   Psychiatric/Behavioral: Negative for hallucinations.    Allergies  Sulfa antibiotics  Home Medications   Current Outpatient Rx  Name Route Sig Dispense Refill  . CLONAZEPAM 0.5 MG PO TABS Oral Take 0.5 mg by mouth 4 (four) times daily.     [name] Kitchen FLUOXETINE HCL 20 MG PO TABS Oral Take 20 mg by mouth daily.     [name] Kitchen GABAPENTIN 300 MG PO CAPS Oral Take 300 mg by mouth 3 (three) times daily.      [name] Kitchen METHOCARBAMOL 750 MG PO TABS  1-2 po QID prn back and shoulder pain 40 tablet 0    Physical Exam    BP 109/78  Pulse 99  Temp(Src) 98.3 F (36.8 C) (Oral)  Resp 20  Ht 5\' 3"  (1.6 m)  Wt 171 lb (77.565 kg)  BMI 30.29 kg/m2  SpO2  100%  LMP 06/21/2011  Physical Exam  Constitutional: She is oriented to person, place, and time. She appears well-developed.  HENT:  Head: Normocephalic and atraumatic.  Eyes: Conjunctivae and EOM are normal. No scleral icterus.  Neck: Neck supple. No thyromegaly present.  Cardiovascular: Normal rate and regular rhythm.  Exam reveals no gallop and no friction rub.   No murmur heard. Pulmonary/Chest: Effort normal. No stridor. No respiratory distress. She has no wheezes. She has no rales. She exhibits no tenderness.  Abdominal: Soft. She exhibits no distension. There is  tenderness. There is no rebound.       LLQ and RLQ tenderness with significant bruising,   Musculoskeletal: Normal range of motion. She exhibits no edema.  Lymphadenopathy:    She has no cervical adenopathy.  Neurological: She is alert and oriented to person, place, and time. Coordination normal.  Skin: No rash noted. No erythema.       RLQ and LLQ significant bruising.  Lower abdominal surgical incision is healing appropriately with no signs of infection.    Psychiatric: She has a normal mood and affect. Her behavior is normal.    ED Course  Procedures (including critical care time) OTHER DATA REVIEWED: Nursing notes, vital signs, and past medical records reviewed.   DIAGNOSTIC STUDIES: Oxygen Saturation is 100% on room air, normal by my interpretation.       LABS / RADIOLOGY:  Results for orders placed during Marissa hospital encounter of 07/12/11  CBC      Component Value Range   WBC 7.8  4.0 - 10.5 (K/uL)   RBC 2.57 (*) 3.87 - 5.11 (MIL/uL)   Hemoglobin 7.8 (*) 12.0 - 15.0 (g/dL)   HCT 40.9 (*) 81.1 - 46.0 (%)   MCV 92.6  78.0 - 100.0 (fL)   MCH 30.4  26.0 - 34.0 (pg)   MCHC 32.8  30.0 - 36.0 (g/dL)   RDW 91.4  78.2 - 95.6 (%)   Platelets 358  150 - 400 (K/uL)  DIFFERENTIAL      Component Value Range   Neutrophils Relative PENDING  43 - 77 (%)   Neutro Abs PENDING  1.7 - 7.7 (K/uL)   Band Neutrophils PENDING  0 - 10 (%)   Lymphocytes Relative PENDING  12 - 46 (%)   Lymphs Abs PENDING  0.7 - 4.0 (K/uL)   Monocytes Relative PENDING  3 - 12 (%)   Monocytes Absolute PENDING  0.1 - 1.0 (K/uL)   Eosinophils Relative PENDING  0 - 5 (%)   Eosinophils Absolute PENDING  0.0 - 0.7 (K/uL)   Basophils Relative PENDING  0 - 1 (%)   Basophils Absolute PENDING  0.0 - 0.1 (K/uL)   WBC Morphology PENDING     RBC Morphology PENDING     Smear Review PENDING     nRBC PENDING  0 (/100 WBC)   Metamyelocytes Relative PENDING     Myelocytes PENDING     Promyelocytes Absolute PENDING       Blasts PENDING    COMPREHENSIVE METABOLIC PANEL      Component Value Range   Sodium 135  135 - 145 (mEq/L)   Potassium 3.6  3.5 - 5.1 (mEq/L)   Chloride 99  96 - 112 (mEq/L)   CO2 26  19 - 32 (mEq/L)   Glucose, Bld 105 (*) 70 - 99 (mg/dL)   BUN 9  6 - 23 (mg/dL)   Creatinine, Ser 2.13  0.50 - 1.10 (mg/dL)   Calcium  8.9  8.4 - 10.5 (mg/dL)   Total Protein 6.5  6.0 - 8.3 (g/dL)   Albumin 3.0 (*) 3.5 - 5.2 (g/dL)   AST 14  0 - 37 (U/L)   ALT 10  0 - 35 (U/L)   Alkaline Phosphatase 117  39 - 117 (U/L)   Total Bilirubin 0.3  0.3 - 1.2 (mg/dL)   GFR calc non Af Amer >60  >60 (mL/min)   GFR calc Af Amer >60  >60 (mL/min)  TYPE AND SCREEN      Component Value Range   ABO/RH(D) O POS     Antibody Screen PENDING     Sample Expiration 07/15/2011    URINALYSIS, ROUTINE W REFLEX MICROSCOPIC      Component Value Range   Color, Urine STRAW (*) YELLOW    Appearance CLEAR  CLEAR    Specific Gravity, Urine 1.010  1.005 - 1.030    pH 6.5  5.0 - 8.0    Glucose, UA NEGATIVE  NEGATIVE (mg/dL)   Hgb urine dipstick NEGATIVE  NEGATIVE    Bilirubin Urine NEGATIVE  NEGATIVE    Ketones, ur NEGATIVE  NEGATIVE (mg/dL)   Protein, ur NEGATIVE  NEGATIVE (mg/dL)   Urobilinogen, UA 0.2  0.0 - 1.0 (mg/dL)   Nitrite NEGATIVE  NEGATIVE    Leukocytes, UA NEGATIVE  NEGATIVE      No results found.    ED COURSE / COORDINATION OF CARE: 7:44PM  Physical exam complete.  Will order IV fluids, CT Abdomen and labs.  I spoke to her gyn md and he had Marissa ct results and orthostatic results .  He stated her hg was 6.3 at discharge.  He stated Marissa pt could go home and call him tomorrow with follow up  Orders Placed This Encounter  Procedures  . CT Abdomen Pelvis W Contrast  . CBC  . Differential  . Comprehensive metabolic panel  . Urinalysis with microscopic  . Type and screen    MDM: abd pain from hysterectomy   IMPRESSION: Diagnoses that have been ruled out:  Diagnoses that are still under  consideration:  Final diagnoses:     MEDICATIONS GIVEN IN [facility]issa E.D. Scheduled Meds:    . sodium chloride   Intravenous Once  . morphine      . ondansetron  4 mg Intravenous Once  . sodium chloride  1,000 mL Intravenous Once   Continuous Infusions:     DISCHARGE MEDICATIONS: New Prescriptions   No medications on file     ,Marissa chart was scribed for me under my direct supervision.  I personally performed Marissa history, physical, and medical decision making and all procedures in Marissa evaluation of this [name].Marissa Lennert, MD 07/12/11 2230

## 2011-07-12 NOTE — ED Notes (Signed)
Pt presents with abd pain after having full hysterectomy on 07/07/2011. Pt abd swollen and bruised. Pt states pain is increasing and she can not take the pain anymore. Pt tearful in triage. Pt states she also feels like she is running a fever.

## 2011-07-12 NOTE — ED Notes (Signed)
Noted old surgical incision site clean and dry. No signs of infection noted at site. No drainage or foul odor noted. There is old dark purple colored bruising  Noted to lower abdomen above the the surgical incision site.

## 2011-07-21 LAB — BASIC METABOLIC PANEL
BUN: 7
Calcium: 9.9
GFR calc non Af Amer: >60
Potassium: 3.9
Sodium: 141

## 2011-07-21 LAB — DIFFERENTIAL
Basophils Absolute: 0.1
Eosinophils Relative: 5
Lymphocytes Relative: 52 — ABNORMAL HIGH
Lymphs Abs: 4.1 — ABNORMAL HIGH
Neutro Abs: 2.8

## 2011-07-21 LAB — ETHANOL: Alcohol, Ethyl (B): <5

## 2011-07-21 LAB — CBC
HCT: 34.5 — ABNORMAL LOW
Platelets: 378
WBC: 7.9

## 2011-07-21 LAB — RAPID URINE DRUG SCREEN, HOSP PERFORMED
Amphetamines: NOT DETECTED
Cocaine: POSITIVE — AB
Tetrahydrocannabinol: NOT DETECTED

## 2011-07-23 LAB — CBC
HCT: 25.6 — ABNORMAL LOW
HCT: 30.5 — ABNORMAL LOW
Hemoglobin: 10.3 — ABNORMAL LOW
Hemoglobin: 8.9 — ABNORMAL LOW
MCHC: 34.8
MCV: 93.9
MCV: 94.9
Platelets: 311
RBC: 2.73 — ABNORMAL LOW
RBC: 3.22 — ABNORMAL LOW
RDW: 14.4
WBC: 13.1 — ABNORMAL HIGH

## 2011-07-30 LAB — COMPREHENSIVE METABOLIC PANEL
ALT: 25
Albumin: 3.1 — ABNORMAL LOW
Alkaline Phosphatase: 88
BUN: 7
Chloride: 104
Glucose, Bld: 122 — ABNORMAL HIGH
Potassium: 3.2 — ABNORMAL LOW
Sodium: 137
Total Bilirubin: 0.5
Total Protein: 7

## 2011-07-30 LAB — DIFFERENTIAL
Basophils Absolute: 0
Basophils Absolute: 0
Basophils Relative: 0
Basophils Relative: 0
Eosinophils Absolute: 0.1
Eosinophils Absolute: 0.2
Eosinophils Relative: 2
Lymphocytes Relative: 20
Monocytes Absolute: 0.9 — ABNORMAL HIGH
Monocytes Relative: 6

## 2011-07-30 LAB — CBC
HCT: 31.1 — ABNORMAL LOW
HCT: 31.8 — ABNORMAL LOW
Hemoglobin: 10.9 — ABNORMAL LOW
MCHC: 33.7
Platelets: 273
Platelets: 306
RDW: 14.1 — ABNORMAL HIGH
RDW: 14.1 — ABNORMAL HIGH
WBC: 14.9 — ABNORMAL HIGH

## 2011-07-30 LAB — URINALYSIS, ROUTINE W REFLEX MICROSCOPIC
Bilirubin Urine: NEGATIVE
Glucose, UA: NEGATIVE
Glucose, UA: NEGATIVE
Hgb urine dipstick: NEGATIVE
Ketones, ur: NEGATIVE
Nitrite: POSITIVE — AB
Specific Gravity, Urine: <1.005 — ABNORMAL LOW
Specific Gravity, Urine: >1.03 — ABNORMAL HIGH
pH: 5.5
pH: 5.5

## 2011-07-30 LAB — URINE MICROSCOPIC-ADD ON

## 2011-07-30 LAB — BASIC METABOLIC PANEL
BUN: 8
CO2: 27
GFR calc non Af Amer: >60
Glucose, Bld: 105 — ABNORMAL HIGH
Potassium: 3.5

## 2011-07-30 LAB — CULTURE, BLOOD (ROUTINE X 2)
Culture: NO GROWTH
Report Status: 10252008

## 2011-07-30 LAB — URINE CULTURE: Colony Count: 1000

## 2011-08-04 LAB — CBC
HCT: 26.7 — ABNORMAL LOW
HCT: 32.6 — ABNORMAL LOW
MCV: 93.7
Platelets: 241
Platelets: 260
RBC: 2.85 — ABNORMAL LOW
RBC: 3.05 — ABNORMAL LOW
WBC: 10.5
WBC: 12.2 — ABNORMAL HIGH
WBC: 14 — ABNORMAL HIGH

## 2011-08-04 LAB — URINALYSIS, ROUTINE W REFLEX MICROSCOPIC
Nitrite: NEGATIVE
Protein, ur: NEGATIVE
Specific Gravity, Urine: 1.01
Urobilinogen, UA: 0.2

## 2011-08-04 LAB — RPR: RPR Ser Ql: NONREACTIVE

## 2011-08-17 ENCOUNTER — Emergency Department (HOSPITAL_COMMUNITY)
Admission: EM | Admit: 2011-08-17 | Discharge: 2011-08-17 | Disposition: A | Discharge status: Home / Self Care | Payer: Medicaid Other | Attending: Emergency Medicine | Admitting: Emergency Medicine

## 2011-08-17 ENCOUNTER — Encounter (HOSPITAL_COMMUNITY): Payer: Self-pay

## 2011-08-17 DIAGNOSIS — F17200 Nicotine dependence, unspecified, uncomplicated: Secondary | ICD-10-CM | POA: Insufficient documentation

## 2011-08-17 DIAGNOSIS — F319 Bipolar disorder, unspecified: Secondary | ICD-10-CM | POA: Insufficient documentation

## 2011-08-17 DIAGNOSIS — T8131XA Disruption of external operation (surgical) wound, not elsewhere classified, initial encounter: Secondary | ICD-10-CM | POA: Insufficient documentation

## 2011-08-17 DIAGNOSIS — Y838 Other surgical procedures as the cause of abnormal reaction of the patient, or of later complication, without mention of misadventure at the time of the procedure: Secondary | ICD-10-CM | POA: Insufficient documentation

## 2011-08-17 DIAGNOSIS — T8130XA Disruption of wound, unspecified, initial encounter: Secondary | ICD-10-CM

## 2011-08-17 DIAGNOSIS — Z9079 Acquired absence of other genital organ(s): Secondary | ICD-10-CM | POA: Insufficient documentation

## 2011-08-17 MED ORDER — SODIUM CHLORIDE 0.9 % IV SOLN
Freq: Once | INTRAVENOUS | Status: AC
  Administered 2011-08-17: 14:00:00 via INTRAVENOUS

## 2011-08-17 MED ORDER — DIPHENHYDRAMINE HCL 50 MG/ML IJ SOLN
50.0000 mg | Freq: Once | INTRAMUSCULAR | Status: AC
  Administered 2011-08-17: 50 mg via INTRAVENOUS
  Filled 2011-08-17: qty 1

## 2011-08-17 MED ORDER — DOXYCYCLINE HYCLATE 100 MG PO CAPS: 100.0000 mg | ORAL_CAPSULE | Freq: Two times a day (BID) | ORAL | Status: AC

## 2011-08-17 MED ORDER — KETOROLAC TROMETHAMINE 30 MG/ML IJ SOLN
30.0000 mg | Freq: Once | INTRAMUSCULAR | Status: AC
  Administered 2011-08-17: 30 mg via INTRAVENOUS
  Filled 2011-08-17: qty 1

## 2011-08-17 MED ORDER — DOXYCYCLINE HYCLATE 100 MG PO TABS
100.0000 mg | ORAL_TABLET | Freq: Once | ORAL | Status: AC
  Administered 2011-08-17: 100 mg via ORAL
  Filled 2011-08-17: qty 1

## 2011-08-17 MED ORDER — TRAMADOL-ACETAMINOPHEN 37.5-325 MG PO TABS: ORAL_TABLET | ORAL | Status: AC

## 2011-08-17 MED ORDER — PROMETHAZINE HCL 25 MG PO TABS: 25.0000 mg | ORAL_TABLET | Freq: Four times a day (QID) | ORAL | Status: AC | PRN

## 2011-08-17 MED ORDER — FENTANYL CITRATE 0.05 MG/ML IJ SOLN
50.0000 ug | Freq: Once | INTRAMUSCULAR | Status: AC
  Administered 2011-08-17: 100 ug via INTRAVENOUS
  Filled 2011-08-17: qty 2

## 2011-08-17 NOTE — ED Provider Notes (Signed)
History     CSN: [id number] Arrival date & time: 08/17/2011 12:23 PM   First MD Initiated Contact with Patient 08/17/11 1258      Chief Complaint  Patient presents with  . Wound Dehiscence    (Consider location/radiation/quality/duration/timing/severity/associated sxs/prior treatment) HPI  Patient relates she had a hysterectomy done at [facility] with Dr. Ralph Dowdy. She relates she was seen here 3 weeks ago when she had a lot of bruising around  the wound and had a CT scan done which she states showed a cut blood vessel in. She states she's been seen by her gynecologist since then and also she was seen by him 4 days ago. She relates she's been concerned about her wound and she was told by home 4 days ago looked fine. She relates she was on an unknown antibiotic a couple weeks ago but she doesn't know how long ago she states she states she's had a slight fever she has nausea vomiting she's had some diarrhea and headache she states the wound open up one or 2 weeks ago and "I can't stand it anymore". She relates it has been draining nothing makes it feel better nothing makes it feel worse  Past Medical History  Diagnosis Date  . Depression    bipolar  Past Surgical History  Procedure Date  . Ovarian cyst removal   . Abdominal hysterectomy     History reviewed. No pertinent family history.  History  Substance Use Topics  . Smoking status: Current Everyday Smoker -- 0.5 packs/day    Types: Cigarettes  . Smokeless tobacco: Not on file  . Alcohol Use: No   patient on disability because of bipolar  OB History    Grav Para Term Preterm Abortions TAB SAB Ect Mult Living                  Review of Systems  All other systems reviewed and are negative.    Allergies  Sulfa antibiotics  Home Medications   Current Outpatient Rx  Name Route Sig Dispense Refill  . CLONAZEPAM 0.5 MG PO TABS Oral Take 0.5 mg by mouth 4 (four) times daily.     [name] Kitchen DOXYCYCLINE HYCLATE 100 MG PO  CAPS Oral Take 1 capsule (100 mg total) by mouth 2 (two) times daily. 20 capsule 0  . FLUOXETINE HCL 20 MG PO TABS Oral Take 20 mg by mouth daily.     [name] Kitchen GABAPENTIN 300 MG PO CAPS Oral Take 300 mg by mouth 3 (three) times daily.      [name] Kitchen METHOCARBAMOL 750 MG PO TABS  1-2 po QID prn back and shoulder pain 40 tablet 0  . PROMETHAZINE HCL 25 MG PO TABS Oral Take 1 tablet (25 mg total) by mouth every 6 (six) hours as needed for nausea. 30 tablet 0  . TRAMADOL-ACETAMINOPHEN 37.5-325 MG PO TABS  2 tabs po QID prn pain 16 tablet 0    BP 106/57  Pulse 64  Temp(Src) 98.5 F (36.9 C) (Oral)  Resp 21  Ht 5\' 3"  (1.6 m)  Wt 162 lb 6.4 oz (73.664 kg)  BMI 28.77 kg/m2  SpO2 98%  LMP 06/21/2011  VS normal  Physical Exam  Vitals reviewed. Constitutional: She is oriented to person, place, and time. She appears well-developed and well-nourished.       Patient's speech is very slurred and hard to understand I had asked her to repeat herself several times because I could not understand what she was saying.  HENT:  Head: Normocephalic and atraumatic.  Eyes: Conjunctivae and EOM are normal. Pupils are equal, round, and reactive to light.  Neck: Normal range of motion. Neck supple.  Cardiovascular: Normal rate, regular rhythm and normal heart sounds.   Pulmonary/Chest: Effort normal and breath sounds normal.  Abdominal: Soft. Bowel sounds are normal.       Patient has a healing surgical scar in her lower abdomen there is an area on either and that has dehisced the left side is approximately 1 cm there are right side is approximately 1/2 cm. There appears to be some clear drainage coming out of it there is minimal redness around the wound edges the rest of the wound appears to be healing fine.  Musculoskeletal: Normal range of motion.  Neurological: She is alert and oriented to person, place, and time.  Skin: Skin is warm and dry.  Psychiatric:       Patient appears very flat    ED Course  Procedures  (including critical care time)  Patient given IV fluids,  toradol and fentanyl. . She has multiple ER visits in the past     .Devoria Albe, MD, FACEP   1. Wound dehiscence     Medications  doxycycline (VIBRA-TABS) tablet 100 mg (not administered)  doxycycline (VIBRAMYCIN) 100 MG capsule (not administered)  traMADol-acetaminophen (ULTRACET) 37.5-325 MG per tablet (not administered)  promethazine (PHENERGAN) 25 MG tablet (not administered)  fentaNYL (SUBLIMAZE) 0.05 MG/ML injection 50 mcg (not administered)  ketorolac (TORADOL) 30 MG/ML injection 30 mg (30 mg Intravenous Given 08/17/11 1358)  diphenhydrAMINE (BENADRYL) injection 50 mg (50 mg Intravenous Given 08/17/11 1358)  0.9 %  sodium chloride infusion (  Intravenous New Bag 08/17/11 1358)    Devoria Albe, MD, FACEP   MDM          Ward Givens, MD 08/17/11 2231

## 2011-08-17 NOTE — ED Notes (Signed)
Pt presents with open wound from hysterectomy 6 weeks ago. Pt states 1 week ago wound opened. Wound appears to be infected. Pt states she has not slept.

## 2011-11-29 ENCOUNTER — Emergency Department (HOSPITAL_COMMUNITY)
Admission: EM | Admit: 2011-11-29 | Discharge: 2011-11-29 | Disposition: A | Discharge status: Home / Self Care | Payer: Medicaid Other | Attending: Emergency Medicine | Admitting: Emergency Medicine

## 2011-11-29 ENCOUNTER — Encounter (HOSPITAL_COMMUNITY): Payer: Self-pay

## 2011-11-29 DIAGNOSIS — J029 Acute pharyngitis, unspecified: Secondary | ICD-10-CM | POA: Insufficient documentation

## 2011-11-29 DIAGNOSIS — F17200 Nicotine dependence, unspecified, uncomplicated: Secondary | ICD-10-CM | POA: Insufficient documentation

## 2011-11-29 DIAGNOSIS — F3289 Other specified depressive episodes: Secondary | ICD-10-CM | POA: Insufficient documentation

## 2011-11-29 DIAGNOSIS — Z79899 Other long term (current) drug therapy: Secondary | ICD-10-CM | POA: Insufficient documentation

## 2011-11-29 DIAGNOSIS — F329 Major depressive disorder, single episode, unspecified: Secondary | ICD-10-CM | POA: Insufficient documentation

## 2011-11-29 LAB — RAPID STREP SCREEN (MED CTR MEBANE ONLY): Streptococcus, Group A Screen (Direct): NEGATIVE

## 2011-11-29 MED ORDER — LIDOCAINE VISCOUS 2 % MT SOLN: 15.0000 mL | OROMUCOSAL | Status: AC | PRN

## 2011-11-29 MED ORDER — IBUPROFEN 800 MG PO TABS: 800.0000 mg | ORAL_TABLET | Freq: Three times a day (TID) | ORAL | Status: AC

## 2011-11-29 MED ORDER — IBUPROFEN 800 MG PO TABS
800.0000 mg | ORAL_TABLET | Freq: Once | ORAL | Status: AC
  Administered 2011-11-29: 800 mg via ORAL
  Filled 2011-11-29: qty 1

## 2011-11-29 NOTE — ED Notes (Signed)
Pt a/ox4. Resp even and unlabored. NAD at this time. D/C instructions reviewed with pt. Pt verbalized understanding. Pt ambulated to lobby with steady gate.  

## 2011-11-29 NOTE — ED Notes (Signed)
Sore throat and white blisters in mouth

## 2011-11-29 NOTE — ED Notes (Signed)
Pt presents to the Ed with a sore throat. Pt states that she noticed yesterday white patches in the back of her throat and began having pain as well. Pt states that she has only been able to eat soft foods. Pt states that she did a warm salt water gargle and tylenol and cough drops but no improvement. Pt ambulated to the Ed pt A/O X 4. Pt resp is normal and unlabored. Pt states her pain is a 9 on a scale of 0-10.

## 2011-12-01 NOTE — ED Provider Notes (Signed)
History     CSN: [id number]  Arrival date & time 11/29/11  1200   First MD Initiated Contact with Patient 11/29/11 1249      Chief Complaint  Patient presents with  . Sore Throat    (Consider location/radiation/quality/duration/timing/severity/associated sxs/prior treatment) Patient is a 36 y.o. female presenting with pharyngitis. The history is provided by the patient.  Sore Throat The current episode started yesterday (She reports reports developing a sore throat yesterday, accompanied by white blisters at the back of her throat, which she states she "popped". She has persistent severe pain worse with swallowing.). The problem occurs constantly. The problem has been gradually worsening. Associated symptoms include a sore throat. Pertinent negatives include no abdominal pain, arthralgias, chest pain, congestion, fever, headaches, joint swelling, nausea, neck pain, numbness, rash or weakness. The symptoms are aggravated by swallowing. She has tried acetaminophen for the symptoms. The treatment provided no relief.    Past Medical History  Diagnosis Date  . Depression     Past Surgical History  Procedure Date  . Ovarian cyst removal   . Abdominal hysterectomy     No family history on file.  History  Substance Use Topics  . Smoking status: Current Everyday Smoker -- 0.5 packs/day    Types: Cigarettes  . Smokeless tobacco: Not on file  . Alcohol Use: No    OB History    Grav Para Term Preterm Abortions TAB SAB Ect Mult Living                  Review of Systems  Constitutional: Negative for fever.  HENT: Positive for sore throat. Negative for congestion and neck pain.   Eyes: Negative.   Respiratory: Negative for chest tightness and shortness of breath.   Cardiovascular: Negative for chest pain.  Gastrointestinal: Negative for nausea and abdominal pain.  Genitourinary: Negative.   Musculoskeletal: Negative for joint swelling and arthralgias.  Skin: Negative.   Negative for rash and wound.  Neurological: Negative for dizziness, weakness, light-headedness, numbness and headaches.  Hematological: Negative.   Psychiatric/Behavioral: Negative.     Allergies  Sulfa antibiotics  Home Medications   Current Outpatient Rx  Name Route Sig Dispense Refill  . CLONAZEPAM 0.5 MG PO TABS Oral Take 0.5 mg by mouth 4 (four) times daily.     [name] Kitchen FLUOXETINE HCL 20 MG PO TABS Oral Take 20 mg by mouth daily.     . IBUPROFEN 800 MG PO TABS Oral Take 1 tablet (800 mg total) by mouth 3 (three) times daily. 21 tablet 0  . LIDOCAINE VISCOUS 2 % MT SOLN Oral Take 15 mLs by mouth as needed for pain (Gargle and spit  every 2 hours prn pain). 100 mL 0    BP 121/63  Pulse 96  Temp(Src) 98.6 F (37 C) (Oral)  Resp 22  Ht 5\' 3"  (1.6 m)  Wt 160 lb (72.576 kg)  BMI 28.34 kg/m2  SpO2 99%  LMP 06/21/2011  Physical Exam  Nursing note and vitals reviewed. Constitutional: She is oriented to person, place, and time. She appears well-developed and well-nourished.  HENT:  Head: Normocephalic and atraumatic.  Right Ear: External ear normal.  Left Ear: External ear normal.  Mouth/Throat: Uvula is midline and mucous membranes are normal. Posterior oropharyngeal erythema present. No oropharyngeal exudate, posterior oropharyngeal edema or tonsillar abscesses.       No blistering or lesions in oropharynx appreciated.  Eyes: Conjunctivae are normal.  Neck: Normal range of motion.  Cardiovascular: Normal  rate, regular rhythm, normal heart sounds and intact distal pulses.   Pulmonary/Chest: Effort normal and breath sounds normal. She has no wheezes.  Abdominal: Soft. Bowel sounds are normal. There is no tenderness.  Musculoskeletal: Normal range of motion.  Neurological: She is alert and oriented to person, place, and time.  Skin: Skin is warm and dry.  Psychiatric: She has a normal mood and affect.    ED Course  Procedures (including critical care time)   Labs Reviewed    RAPID STREP SCREEN  LAB REPORT - SCANNED   No results found.   1. Pharyngitis       MDM  Rapid strep test was negative today. Ibuprofen and viscous lidocaine recommended for pain control while the scleral pharyngitis runs its course.        Candis Musa, PA 12/01/11 [phone]

## 2011-12-01 NOTE — ED Provider Notes (Signed)
Medical screening examination/treatment/procedure(s) were performed by non-physician practitioner and as supervising physician I was immediately available for consultation/collaboration.   Christpoher Sievers L Topaz Raglin, MD 12/01/11 1921 

## 2012-08-01 ENCOUNTER — Encounter (HOSPITAL_COMMUNITY): Payer: Self-pay

## 2012-08-01 ENCOUNTER — Emergency Department (HOSPITAL_COMMUNITY)
Admission: EM | Admit: 2012-08-01 | Discharge: 2012-08-01 | Disposition: A | Discharge status: Home / Self Care | Payer: Medicaid Other | Attending: Emergency Medicine | Admitting: Emergency Medicine

## 2012-08-01 ENCOUNTER — Emergency Department (HOSPITAL_COMMUNITY): Payer: Medicaid Other

## 2012-08-01 DIAGNOSIS — R109 Unspecified abdominal pain: Secondary | ICD-10-CM | POA: Insufficient documentation

## 2012-08-01 HISTORY — DX: Cyst of kidney, acquired: N28.1

## 2012-08-01 LAB — CBC WITH DIFFERENTIAL/PLATELET
HCT: 37.4 % (ref 36.0–46.0)
Hemoglobin: 12.8 g/dL (ref 12.0–15.0)
Lymphocytes Relative: 44 % (ref 12–46)
Lymphs Abs: 5.7 10*3/uL — ABNORMAL HIGH (ref 0.7–4.0)
MCH: 31.4 pg (ref 26.0–34.0)
MCHC: 34.2 g/dL (ref 30.0–36.0)
Neutro Abs: 6.1 10*3/uL (ref 1.7–7.7)
RDW: 12.7 % (ref 11.5–15.5)

## 2012-08-01 LAB — BASIC METABOLIC PANEL
CO2: 26 mEq/L (ref 19–32)
Chloride: 101 mEq/L (ref 96–112)
Glucose, Bld: 102 mg/dL — ABNORMAL HIGH (ref 70–99)
Potassium: 3.4 mEq/L — ABNORMAL LOW (ref 3.5–5.1)
Sodium: 136 mEq/L (ref 135–145)

## 2012-08-01 LAB — URINALYSIS, ROUTINE W REFLEX MICROSCOPIC
Bilirubin Urine: NEGATIVE
Hgb urine dipstick: NEGATIVE
Nitrite: NEGATIVE
Specific Gravity, Urine: >1.03 — ABNORMAL HIGH (ref 1.005–1.030)
Urobilinogen, UA: 0.2 mg/dL (ref 0.0–1.0)
pH: 6 (ref 5.0–8.0)

## 2012-08-01 MED ORDER — SODIUM CHLORIDE 0.9 % IV SOLN
1000.0000 mL | Freq: Once | INTRAVENOUS | Status: AC
  Administered 2012-08-01: 1000 mL via INTRAVENOUS

## 2012-08-01 MED ORDER — ONDANSETRON HCL 4 MG/2ML IJ SOLN
4.0000 mg | Freq: Once | INTRAMUSCULAR | Status: AC
  Administered 2012-08-01: 4 mg via INTRAVENOUS
  Filled 2012-08-01: qty 2

## 2012-08-01 MED ORDER — HYDROMORPHONE HCL PF 1 MG/ML IJ SOLN
1.0000 mg | Freq: Once | INTRAMUSCULAR | Status: AC
  Administered 2012-08-01: 1 mg via INTRAVENOUS
  Filled 2012-08-01: qty 1

## 2012-08-01 MED ORDER — SODIUM CHLORIDE 0.9 % IV SOLN
1000.0000 mL | INTRAVENOUS | Status: DC
  Administered 2012-08-01: 1000 mL via INTRAVENOUS

## 2012-08-01 MED ORDER — HYDROCODONE-ACETAMINOPHEN 5-325 MG PO TABS: 1.0000 | ORAL_TABLET | ORAL | Status: DC | PRN

## 2012-08-01 MED ORDER — FENTANYL CITRATE 0.05 MG/ML IJ SOLN
50.0000 ug | Freq: Once | INTRAMUSCULAR | Status: AC
  Administered 2012-08-01: 50 ug via INTRAVENOUS
  Filled 2012-08-01: qty 2

## 2012-08-01 MED ORDER — ONDANSETRON HCL 4 MG PO TABS
4.0000 mg | ORAL_TABLET | Freq: Once | ORAL | Status: AC
  Administered 2012-08-01: 4 mg via ORAL
  Filled 2012-08-01: qty 1

## 2012-08-01 NOTE — ED Provider Notes (Signed)
History     CSN: [id number]  Arrival date & time 08/01/12  1858   First MD Initiated Contact with Patient 08/01/12 1922      Chief Complaint  Patient presents with  . Flank Pain    (Consider location/radiation/quality/duration/timing/severity/associated sxs/prior treatment) HPI Comments: Is to the emergency department with complaint of left flank area pain. The patient states this problem started to get" bad" on yesterday. Patient states she's had some pain off and on for some time. The patient states that she has increased frequency of urination, but no burning or pain with urination. The pain in the flank area is described as a sharp and at times burning-type pain at times it starts from her back and at other times it starts in her left flank and radiates to the left lower abdomen and suprapubic skin area. The patient denies any hematuria present. There's been no change in the bowels. No blood in the stools. No injury or trauma to this area. There's been no high fever reported. It is of note the patient states she has had a complete hysterectomy recently.  Patient is a 36 y.o. female presenting with flank pain. The history is provided by the patient.  Flank Pain This is a new problem. The current episode started yesterday. The problem occurs constantly. The problem has been gradually worsening. Associated symptoms include headaches. Pertinent negatives include no abdominal pain, arthralgias, chest pain, coughing, fever, nausea, neck pain or vomiting. Nothing aggravates the symptoms. She has tried nothing for the symptoms. The treatment provided no relief.    Past Medical History  Diagnosis Date  . Depression   . Cyst of kidney, acquired     Past Surgical History  Procedure Date  . Ovarian cyst removal   . Abdominal hysterectomy     History reviewed. No pertinent family history.  History  Substance Use Topics  . Smoking status: Current Every Day Smoker -- 0.5 packs/day   Types: Cigarettes  . Smokeless tobacco: Not on file  . Alcohol Use: No    OB History    Grav Para Term Preterm Abortions TAB SAB Ect Mult Living                  Review of Systems  Constitutional: Negative for fever and activity change.       All ROS Neg except as noted in HPI  HENT: Negative for nosebleeds and neck pain.   Eyes: Negative for photophobia and discharge.  Respiratory: Negative for cough, shortness of breath and wheezing.   Cardiovascular: Negative for chest pain and palpitations.  Gastrointestinal: Negative for nausea, vomiting, abdominal pain and blood in stool.  Genitourinary: Positive for flank pain. Negative for dysuria, frequency and hematuria.  Musculoskeletal: Negative for back pain and arthralgias.  Skin: Negative.   Neurological: Positive for headaches. Negative for dizziness, seizures and speech difficulty.  Psychiatric/Behavioral: Negative for hallucinations and confusion.       Depression    Allergies  Sulfa antibiotics  Home Medications   Current Outpatient Rx  Name Route Sig Dispense Refill  . ALPRAZOLAM 1 MG PO TABS Oral Take 1 mg by mouth 4 (four) times daily.    [name] Kitchen FLUOXETINE HCL 20 MG PO TABS Oral Take 20 mg by mouth daily.     [name] Kitchen NAPROXEN 500 MG PO TABS Oral Take 500 mg by mouth 2 (two) times daily.      BP 127/59  Pulse 88  Temp 98.2 F (36.8 C) (Oral)  Resp  20  Ht 5\' 3"  (1.6 m)  Wt 150 lb (68.04 kg)  BMI 26.57 kg/m2  SpO2 99%  LMP 06/21/2011  Physical Exam  Nursing note and vitals reviewed. Constitutional: She is oriented to person, place, and time. She appears well-developed and well-nourished.  Non-toxic appearance.  HENT:  Head: Normocephalic.  Right Ear: Tympanic membrane and external ear normal.  Left Ear: Tympanic membrane and external ear normal.  Eyes: EOM and lids are normal. Pupils are equal, round, and reactive to light.  Neck: Normal range of motion. Neck supple. Carotid bruit is not present.  Cardiovascular:  Normal rate, regular rhythm, normal heart sounds, intact distal pulses and normal pulses.   Pulmonary/Chest: Breath sounds normal. No respiratory distress.  Abdominal: Soft. Bowel sounds are normal. There is no tenderness. There is no guarding.       There is mild soreness of the left lower flank area. There is mild discomfort to the left lower abdomen. There is no mass appreciated. No rebound tenderness present.  Genitourinary:       There is soreness in the suprapubic area and the left pubis area to palpation.  Musculoskeletal: Normal range of motion.  Lymphadenopathy:       Head (right side): No submandibular adenopathy present.       Head (left side): No submandibular adenopathy present.    She has no cervical adenopathy.  Neurological: She is alert and oriented to person, place, and time. She has normal strength. No cranial nerve deficit or sensory deficit.  Skin: Skin is warm and dry.  Psychiatric: Her speech is normal. Her mood appears anxious.    ED Course  Procedures (including critical care time)   Labs Reviewed  URINALYSIS, ROUTINE W REFLEX MICROSCOPIC  URINE CULTURE   No results found.   No diagnosis found.    MDM  Vital signs stable.  bmet reveals low potassium at 3.4. UA reveals specific grav. >1.030. Nitrate and leukocyes neg. CT scan reveals no acute process. Pt made aware of tonights findings. She will follow up with PCP for additional testing.       Kathie Dike, [place] 08/06/12 [phone]

## 2012-08-01 NOTE — ED Notes (Signed)
Not ready for discharge yet, waiting for ERMD re-eval

## 2012-08-01 NOTE — ED Notes (Signed)
Think my kidneys are inflamed per pt. Hurts more on the left side, radiating into lower abdomen per pt.

## 2012-08-03 LAB — URINE CULTURE

## 2012-08-08 ENCOUNTER — Encounter (HOSPITAL_COMMUNITY): Payer: Self-pay

## 2012-08-09 NOTE — ED Provider Notes (Signed)
Medical screening examination/treatment/procedure(s) were performed by non-physician practitioner and as supervising physician I was immediately available for consultation/collaboration.  Donnetta Hutching, MD 08/09/12 [phone]

## 2012-10-15 ENCOUNTER — Emergency Department (HOSPITAL_COMMUNITY)
Admission: EM | Admit: 2012-10-15 | Discharge: 2012-10-15 | Disposition: A | Discharge status: Home / Self Care | Payer: Medicaid Other | Attending: Emergency Medicine | Admitting: Emergency Medicine

## 2012-10-15 ENCOUNTER — Emergency Department (HOSPITAL_COMMUNITY): Payer: Medicaid Other

## 2012-10-15 ENCOUNTER — Encounter (HOSPITAL_COMMUNITY): Payer: Self-pay | Admitting: *Deleted

## 2012-10-15 DIAGNOSIS — Y929 Unspecified place or not applicable: Secondary | ICD-10-CM | POA: Insufficient documentation

## 2012-10-15 DIAGNOSIS — Z791 Long term (current) use of non-steroidal anti-inflammatories (NSAID): Secondary | ICD-10-CM | POA: Insufficient documentation

## 2012-10-15 DIAGNOSIS — R05 Cough: Secondary | ICD-10-CM | POA: Insufficient documentation

## 2012-10-15 DIAGNOSIS — Z79899 Other long term (current) drug therapy: Secondary | ICD-10-CM | POA: Insufficient documentation

## 2012-10-15 DIAGNOSIS — R059 Cough, unspecified: Secondary | ICD-10-CM | POA: Insufficient documentation

## 2012-10-15 DIAGNOSIS — M545 Low back pain, unspecified: Secondary | ICD-10-CM | POA: Insufficient documentation

## 2012-10-15 DIAGNOSIS — W010XXA Fall on same level from slipping, tripping and stumbling without subsequent striking against object, initial encounter: Secondary | ICD-10-CM | POA: Insufficient documentation

## 2012-10-15 DIAGNOSIS — F17200 Nicotine dependence, unspecified, uncomplicated: Secondary | ICD-10-CM | POA: Insufficient documentation

## 2012-10-15 DIAGNOSIS — F329 Major depressive disorder, single episode, unspecified: Secondary | ICD-10-CM | POA: Insufficient documentation

## 2012-10-15 DIAGNOSIS — Z87448 Personal history of other diseases of urinary system: Secondary | ICD-10-CM | POA: Insufficient documentation

## 2012-10-15 DIAGNOSIS — F3289 Other specified depressive episodes: Secondary | ICD-10-CM | POA: Insufficient documentation

## 2012-10-15 DIAGNOSIS — Y939 Activity, unspecified: Secondary | ICD-10-CM | POA: Insufficient documentation

## 2012-10-15 DIAGNOSIS — M533 Sacrococcygeal disorders, not elsewhere classified: Secondary | ICD-10-CM | POA: Insufficient documentation

## 2012-10-15 MED ORDER — HYDROCODONE-ACETAMINOPHEN 5-325 MG PO TABS
ORAL_TABLET | ORAL | Status: AC
  Administered 2012-10-15: 2
  Filled 2012-10-15: qty 2

## 2012-10-15 MED ORDER — IBUPROFEN 600 MG PO TABS: 600.0000 mg | ORAL_TABLET | Freq: Four times a day (QID) | ORAL | Status: DC | PRN

## 2012-10-15 MED ORDER — HYDROCODONE-ACETAMINOPHEN 5-325 MG PO TABS: 1.0000 | ORAL_TABLET | ORAL | Status: AC | PRN

## 2012-10-15 MED ORDER — KETOROLAC TROMETHAMINE 60 MG/2ML IM SOLN
60.0000 mg | Freq: Once | INTRAMUSCULAR | Status: DC
  Filled 2012-10-15: qty 2

## 2012-10-15 NOTE — ED Notes (Signed)
Pt slipped off of porch and landed on a rock, hurting her tailbone. NAD.

## 2012-10-15 NOTE — ED Notes (Addendum)
Pt says she wants a "shot " for pain, When I came in with the med, she said "If's that's toradol, I don't want it" says it makes her "burn".  No hx of  Allergy  Toradol not given.

## 2012-10-15 NOTE — ED Notes (Signed)
Slipped on porch and fell onto buttocks , contusion present. Cough also

## 2012-10-16 NOTE — ED Provider Notes (Signed)
History     CSN: [id number]  Arrival date & time 10/15/12  1147   First MD Initiated Contact with Patient 10/15/12 1403      Chief Complaint  Patient presents with  . Tailbone Pain    (Consider location/radiation/quality/duration/timing/severity/associated sxs/prior treatment) HPI Comments: [name] presents with coccyx and low back pain after slipping last night and landing on her buttocks onto a rock in her yard.  She has had severe pain at this site since the event which is worse with movement, palpation and attempts to sit or apply pressure to this area. She denies weakness or numbness in her legs and has had no urinary or bowel incontinence or retention.  She has taken aleve with no relief of pain and has tried no other treatments.  No pertinent past medical history.  The history is provided by the patient and a friend.    Past Medical History  Diagnosis Date  . Depression   . Cyst of kidney, acquired     Past Surgical History  Procedure Date  . Ovarian cyst removal   . Abdominal hysterectomy     No family history on file.  History  Substance Use Topics  . Smoking status: Current Every Day Smoker -- 0.5 packs/day    Types: Cigarettes  . Smokeless tobacco: Not on file  . Alcohol Use: No    OB History    Grav Para Term Preterm Abortions TAB SAB Ect Mult Living                  Review of Systems  Constitutional: Negative for fever.  Respiratory: Negative for shortness of breath.   Cardiovascular: Negative for chest pain and leg swelling.  Gastrointestinal: Negative for abdominal pain, constipation and abdominal distention.  Genitourinary: Negative for dysuria, urgency, frequency, flank pain and difficulty urinating.  Musculoskeletal: Positive for back pain and arthralgias. Negative for joint swelling and gait problem.  Skin: Negative for rash.  Neurological: Negative for weakness and numbness.    Allergies  Sulfa antibiotics  Home Medications    Current Outpatient Rx  Name  Route  Sig  Dispense  Refill  . ALPRAZOLAM 1 MG PO TABS   Oral   Take 1 mg by mouth 4 (four) times daily.         [name] Kitchen FLUOXETINE HCL 20 MG PO TABS   Oral   Take 20 mg by mouth daily.          [name] Kitchen HYDROCODONE-ACETAMINOPHEN 5-325 MG PO TABS   Oral   Take 1 tablet by mouth every 4 (four) hours as needed for pain.   15 tablet   0   . NAPROXEN 500 MG PO TABS   Oral   Take 500 mg by mouth 2 (two) times daily.         [name] Kitchen HYDROCODONE-ACETAMINOPHEN 5-325 MG PO TABS   Oral   Take 1 tablet by mouth every 4 (four) hours as needed for pain.   20 tablet   0   . IBUPROFEN 600 MG PO TABS   Oral   Take 1 tablet (600 mg total) by mouth every 6 (six) hours as needed for pain.   30 tablet   0     BP 100/66  Temp 98.3 F (36.8 C) (Oral)  Resp 16  Ht 5\' 3"  (1.6 m)  Wt 150 lb (68.04 kg)  BMI 26.57 kg/m2  SpO2 99%  LMP 06/21/2011  Physical Exam  Nursing note and vitals  reviewed. Constitutional: She appears well-developed and well-nourished.  HENT:  Head: Normocephalic.  Eyes: Conjunctivae normal are normal.  Neck: Normal range of motion. Neck supple.  Cardiovascular: Normal rate and intact distal pulses.        Pedal pulses normal.  Pulmonary/Chest: Effort normal.  Abdominal: Soft. Bowel sounds are normal. She exhibits no distension and no mass.  Musculoskeletal: Normal range of motion. She exhibits no edema.       Lumbar back: She exhibits tenderness and edema. She exhibits no swelling, no deformity and no spasm.       Edema and small area of ecchymosis left medial buttock just inferior to coccyx.    Neurological: She is alert. She has normal strength. She displays no atrophy and no tremor. No sensory deficit. She exhibits normal muscle tone. Gait normal.        Ankle flexion and extension intact.  Pt is lying on abdomen during exam and unable to sit for exam secondary to pain.  She was able to ambulate without foot drop.  Normal gait.  Skin:  Skin is warm and dry.  Psychiatric: She has a normal mood and affect.    ED Course  Procedures (including critical care time)  Labs Reviewed - No data to display Dg Lumbar Spine Complete  10/15/2012  *RADIOLOGY REPORT*  Clinical Data: History of injury from fall with pain.  LUMBAR SPINE - COMPLETE 4+ VIEW  Comparison: None.  Findings: There are five non-rib bearing lumbar-type vertebral bodies which are labeled L1-L5 on lateral image.  Alignment is normal.  Intervertebral disc spaces are maintained.  SI joints appear intact.  No fracture, subluxation, dislocation, pars defect, or spondylosis is evident.  IMPRESSION: No lumbar spine abnormality is evident.   Original Report Authenticated By: Onalee Hua Call    Dg Sacrum/coccyx  10/15/2012  *RADIOLOGY REPORT*  Clinical Data: History of injury from fall with pain  SACRUM AND COCCYX - 2+ VIEW  Comparison: Lumbar spine examination.  Findings: Alignment is normal.  Joint spaces are preserved.  No fracture or dislocation is evident.  No soft tissue lesions are seen.  SI joints appear normal.  Pelvic phleboliths are seen.  IMPRESSION: No fracture or dislocation is evident.   Original Report Authenticated By: Onalee Hua Call      1. Coccydynia       MDM  Pt prescribed hydrocodone,  Ibuprofen,  Encouraged donut pillow,  Heat therapy.  xrays reviewed prior to dc home.  F/u with pcp for any problems or concerns.        Burgess Amor, [place] 10/16/12 2355

## 2012-10-17 NOTE — ED Provider Notes (Signed)
Medical screening examination/treatment/procedure(s) were performed by non-physician practitioner and as supervising physician I was immediately available for consultation/collaboration.  Geoffery Lyons, MD 10/17/12 [phone]

## 2012-12-14 ENCOUNTER — Encounter (HOSPITAL_COMMUNITY): Payer: Self-pay

## 2012-12-14 ENCOUNTER — Emergency Department (HOSPITAL_COMMUNITY)
Admission: EM | Admit: 2012-12-14 | Discharge: 2012-12-14 | Disposition: A | Discharge status: Home / Self Care | Payer: Medicaid Other | Attending: Emergency Medicine | Admitting: Emergency Medicine

## 2012-12-14 DIAGNOSIS — R6884 Jaw pain: Secondary | ICD-10-CM | POA: Insufficient documentation

## 2012-12-14 DIAGNOSIS — Z8742 Personal history of other diseases of the female genital tract: Secondary | ICD-10-CM | POA: Insufficient documentation

## 2012-12-14 DIAGNOSIS — K089 Disorder of teeth and supporting structures, unspecified: Secondary | ICD-10-CM | POA: Insufficient documentation

## 2012-12-14 DIAGNOSIS — F17200 Nicotine dependence, unspecified, uncomplicated: Secondary | ICD-10-CM | POA: Insufficient documentation

## 2012-12-14 DIAGNOSIS — Z79899 Other long term (current) drug therapy: Secondary | ICD-10-CM | POA: Insufficient documentation

## 2012-12-14 DIAGNOSIS — F3289 Other specified depressive episodes: Secondary | ICD-10-CM | POA: Insufficient documentation

## 2012-12-14 MED ORDER — ACETAMINOPHEN-CODEINE #3 300-30 MG PO TABS: 1.0000 | ORAL_TABLET | Freq: Four times a day (QID) | ORAL | Status: DC | PRN

## 2012-12-14 MED ORDER — AMOXICILLIN 500 MG PO CAPS: 500.0000 mg | ORAL_CAPSULE | Freq: Three times a day (TID) | ORAL | Status: DC

## 2012-12-14 NOTE — ED Provider Notes (Signed)
History     CSN: [id number]  Arrival date & time 12/14/12  1033   First MD Initiated Contact with Patient 12/14/12 1043      Chief Complaint  Patient presents with  . Dental Pain    (Consider location/radiation/quality/duration/timing/severity/associated sxs/prior treatment) Patient is a 37 y.o. female presenting with tooth pain. The history is provided by the patient.  Dental PainThe primary symptoms include mouth pain. Primary symptoms do not include shortness of breath or cough. The symptoms began more than 1 week ago. The symptoms are worsening. The symptoms occur frequently.  Additional symptoms include: gum swelling and jaw pain. Additional symptoms do not include: nosebleeds, swollen glands and fatigue. Medical issues include: smoking.    Past Medical History  Diagnosis Date  . Depression   . Cyst of kidney, acquired     Past Surgical History  Procedure Laterality Date  . Ovarian cyst removal    . Abdominal hysterectomy      No family history on file.  History  Substance Use Topics  . Smoking status: Current Every Day Smoker -- 0.50 packs/day    Types: Cigarettes  . Smokeless tobacco: Not on file  . Alcohol Use: No    OB History   Grav Para Term Preterm Abortions TAB SAB Ect Mult Living                  Review of Systems  Constitutional: Negative for activity change and fatigue.       All ROS Neg except as noted in HPI  HENT: Positive for dental problem. Negative for nosebleeds and neck pain.   Eyes: Negative for photophobia and discharge.  Respiratory: Negative for cough, shortness of breath and wheezing.   Cardiovascular: Negative for chest pain and palpitations.  Gastrointestinal: Negative for abdominal pain and blood in stool.  Genitourinary: Negative for dysuria, frequency and hematuria.  Musculoskeletal: Negative for back pain and arthralgias.  Skin: Negative.   Neurological: Negative for dizziness, seizures and speech difficulty.   Psychiatric/Behavioral: Negative for hallucinations and confusion.    Allergies  Sulfa antibiotics  Home Medications   Current Outpatient Rx  Name  Route  Sig  Dispense  Refill  . ALPRAZolam (XANAX) 1 MG tablet   Oral   Take 1 mg by mouth 4 (four) times daily.         [name] Kitchen FLUoxetine (PROZAC) 20 MG tablet   Oral   Take 20 mg by mouth daily.          [name] Kitchen gabapentin (NEURONTIN) 300 MG capsule   Oral   Take 300 mg by mouth 3 (three) times daily.           BP 112/70  Pulse 69  Temp(Src) 97.7 F (36.5 C) (Oral)  Resp 16  Ht 5\' 3"  (1.6 m)  Wt 149 lb (67.586 kg)  BMI 26.4 kg/m2  SpO2 100%  LMP 06/21/2011  Physical Exam  Nursing note and vitals reviewed. Constitutional: She is oriented to person, place, and time. She appears well-developed and well-nourished.  Non-toxic appearance.  HENT:  Head: Normocephalic.  Right Ear: Tympanic membrane and external ear normal.  Left Ear: Tympanic membrane and external ear normal.  Pain to palpation of the upper right molars. Mod swelling around the gum, but no visible abscess. Airway patent.  Eyes: EOM and lids are normal. Pupils are equal, round, and reactive to light.  Neck: Normal range of motion. Neck supple. Carotid bruit is not present.  Cardiovascular: Normal rate, regular  rhythm, normal heart sounds, intact distal pulses and normal pulses.   Pulmonary/Chest: Breath sounds normal. No respiratory distress.  Abdominal: Soft. Bowel sounds are normal. There is no tenderness. There is no guarding.  Musculoskeletal: Normal range of motion.  Lymphadenopathy:       Head (right side): No submandibular adenopathy present.       Head (left side): No submandibular adenopathy present.    She has no cervical adenopathy.  Neurological: She is alert and oriented to person, place, and time. She has normal strength. No cranial nerve deficit or sensory deficit.  Skin: Skin is warm and dry.  Psychiatric: She has a normal mood and affect. Her  speech is normal.    ED Course  Procedures (including critical care time)  Labs Reviewed - No data to display No results found.   No diagnosis found.    MDM  I have reviewed nursing notes, vital signs, and all appropriate lab and imaging results for this patient. Encouraged patient to see a dentist as soon as possible. Rx for tylenol #3 and amoxil given to the patientl.       Kathie Dike, [place] 12/17/12 [phone]

## 2012-12-14 NOTE — ED Notes (Signed)
Pt c/o toothache for the past week.

## 2012-12-17 NOTE — ED Provider Notes (Signed)
Medical screening examination/treatment/procedure(s) were performed by non-physician practitioner and as supervising physician I was immediately available for consultation/collaboration.  Glynn Octave, MD 12/17/12 [phone]

## 2013-01-17 ENCOUNTER — Emergency Department (HOSPITAL_COMMUNITY)
Admission: EM | Admit: 2013-01-17 | Discharge: 2013-01-17 | Disposition: A | Discharge status: Home / Self Care | Payer: Medicaid Other | Attending: Emergency Medicine | Admitting: Emergency Medicine

## 2013-01-17 ENCOUNTER — Encounter (HOSPITAL_COMMUNITY): Payer: Self-pay | Admitting: *Deleted

## 2013-01-17 DIAGNOSIS — Z79899 Other long term (current) drug therapy: Secondary | ICD-10-CM | POA: Insufficient documentation

## 2013-01-17 DIAGNOSIS — F3289 Other specified depressive episodes: Secondary | ICD-10-CM | POA: Insufficient documentation

## 2013-01-17 DIAGNOSIS — L02219 Cutaneous abscess of trunk, unspecified: Secondary | ICD-10-CM | POA: Insufficient documentation

## 2013-01-17 DIAGNOSIS — R5381 Other malaise: Secondary | ICD-10-CM | POA: Insufficient documentation

## 2013-01-17 DIAGNOSIS — F329 Major depressive disorder, single episode, unspecified: Secondary | ICD-10-CM | POA: Insufficient documentation

## 2013-01-17 DIAGNOSIS — Z8614 Personal history of Methicillin resistant Staphylococcus aureus infection: Secondary | ICD-10-CM | POA: Insufficient documentation

## 2013-01-17 DIAGNOSIS — F17200 Nicotine dependence, unspecified, uncomplicated: Secondary | ICD-10-CM | POA: Insufficient documentation

## 2013-01-17 DIAGNOSIS — L0291 Cutaneous abscess, unspecified: Secondary | ICD-10-CM

## 2013-01-17 DIAGNOSIS — Z87448 Personal history of other diseases of urinary system: Secondary | ICD-10-CM | POA: Insufficient documentation

## 2013-01-17 DIAGNOSIS — Z90710 Acquired absence of both cervix and uterus: Secondary | ICD-10-CM | POA: Insufficient documentation

## 2013-01-17 MED ORDER — LIDOCAINE HCL (PF) 2 % IJ SOLN
INTRAMUSCULAR | Status: AC
  Administered 2013-01-17: 10 mL
  Filled 2013-01-17: qty 10

## 2013-01-17 MED ORDER — DOXYCYCLINE HYCLATE 100 MG PO CAPS: 100.0000 mg | ORAL_CAPSULE | Freq: Two times a day (BID) | ORAL | Status: DC

## 2013-01-17 MED ORDER — ONDANSETRON 8 MG PO TBDP
8.0000 mg | ORAL_TABLET | Freq: Once | ORAL | Status: AC
  Administered 2013-01-17: 8 mg via ORAL

## 2013-01-17 MED ORDER — ONDANSETRON 8 MG PO TBDP
ORAL_TABLET | ORAL | Status: AC
  Filled 2013-01-17: qty 1

## 2013-01-17 MED ORDER — DOXYCYCLINE HYCLATE 100 MG PO TABS
100.0000 mg | ORAL_TABLET | Freq: Once | ORAL | Status: AC
  Administered 2013-01-17: 100 mg via ORAL
  Filled 2013-01-17: qty 1

## 2013-01-17 MED ORDER — HYDROCODONE-ACETAMINOPHEN 5-325 MG PO TABS
ORAL_TABLET | ORAL | Status: AC
  Filled 2013-01-17: qty 1

## 2013-01-17 MED ORDER — HYDROCODONE-ACETAMINOPHEN 7.5-500 MG/15ML PO SOLN: 10.0000 mL | ORAL | Status: DC | PRN

## 2013-01-17 MED ORDER — HYDROCODONE-ACETAMINOPHEN 5-325 MG PO TABS
1.0000 | ORAL_TABLET | Freq: Once | ORAL | Status: AC
  Administered 2013-01-17: 1 via ORAL

## 2013-01-17 NOTE — ED Notes (Signed)
Pt called x2 to exam room, was outside smoking. Advised of smoking policy when she returned.

## 2013-01-17 NOTE — ED Notes (Signed)
Pt to desk, upset over wait for pa, delay explained, pt nodded understanding and returned to exam room.

## 2013-01-17 NOTE — ED Notes (Signed)
Pt witnessed attempting to drive off, advised that she was not allowed to drive due to pain meds. Stated she was going to jsut sit in the passenger seat and wait for her cousin to come back. Security notified. They will monitor.

## 2013-01-17 NOTE — ED Notes (Signed)
Pt stated she has not been feeling well for the last 2 days, has abcess to right upper groin area for 2 days. Area is swollen and red, painful to touch stated it has not been draining. She has h/o MRSA w/ abcess 6 years ago. Denies any fever.

## 2013-01-17 NOTE — ED Notes (Signed)
Abscess to lower abd  , onset today.  Says she feels weak.

## 2013-01-18 NOTE — ED Provider Notes (Signed)
History     CSN: [id number]  Arrival date & time 01/17/13  1323   First MD Initiated Contact with Patient 01/17/13 1503      Chief Complaint  Patient presents with  . Abscess    (Consider location/radiation/quality/duration/timing/severity/associated sxs/prior treatment) Patient is a 37 y.o. female presenting with abscess. The history is provided by the patient.  Abscess Location:  Torso Torso abscess location:  Abd RLQ Size:  1 cm  Abscess quality: fluctuance, painful and warmth   Abscess quality: not draining and not weeping   Red streaking: no   Duration:  2 days Progression:  Worsening Pain details:    Quality:  Sharp   Timing:  Constant   Progression:  Worsening Chronicity:  New Context: not diabetes and not immunosuppression   Relieved by:  None tried Worsened by:  Nothing tried Ineffective treatments:  Warm compresses and draining/squeezing Associated symptoms: fatigue   Associated symptoms: no anorexia, no fever, no headaches, no nausea and no vomiting   Risk factors: hx of MRSA     Past Medical History  Diagnosis Date  . Depression   . Cyst of kidney, acquired     Past Surgical History  Procedure Laterality Date  . Ovarian cyst removal    . Abdominal hysterectomy    . Cesarean section      History reviewed. No pertinent family history.  History  Substance Use Topics  . Smoking status: Current Every Day Smoker -- 0.50 packs/day    Types: Cigarettes  . Smokeless tobacco: Not on file  . Alcohol Use: No    OB History   Grav Para Term Preterm Abortions TAB SAB Ect Mult Living                  Review of Systems  Constitutional: Positive for fatigue. Negative for fever and chills.  HENT: Negative for facial swelling.   Respiratory: Negative for shortness of breath and wheezing.   Gastrointestinal: Negative for nausea, vomiting and anorexia.  Skin: Positive for wound.  Neurological: Negative for numbness and headaches.    Allergies  Sulfa  antibiotics  Home Medications   Current Outpatient Rx  Name  Route  Sig  Dispense  Refill  . ALPRAZolam (XANAX) 1 MG tablet   Oral   Take 1 mg by mouth 4 (four) times daily.         [name] Kitchen FLUoxetine (PROZAC) 20 MG tablet   Oral   Take 20 mg by mouth daily.          [name] Kitchen gabapentin (NEURONTIN) 300 MG capsule   Oral   Take 300 mg by mouth 3 (three) times daily.         [name] Kitchen doxycycline (VIBRAMYCIN) 100 MG capsule   Oral   Take 1 capsule (100 mg total) by mouth 2 (two) times daily.   20 capsule   0   . HYDROcodone-acetaminophen (LORTAB) 7.5-500 MG/15ML solution   Oral   Take 10 mLs by mouth every 4 (four) hours as needed for pain.   120 mL   0     BP 98/61  Pulse 106  Temp(Src) 97.8 F (36.6 C) (Oral)  Resp 20  Ht 5\' 3"  (1.6 m)  Wt 150 lb (68.04 kg)  BMI 26.58 kg/m2  SpO2 100%  LMP 06/21/2011  Physical Exam  Constitutional: She appears well-developed and well-nourished. No distress.  HENT:  Head: Normocephalic.  Neck: Neck supple.  Cardiovascular: Normal rate.   Pulmonary/Chest: Effort normal. She has  no wheezes.  Musculoskeletal: Normal range of motion. She exhibits no edema.  Skin: No rash noted. No erythema.  1 cm raised,  Fluctuant pustule right lower abdomen with induration about 0.5 cm around the border,  With another 1 cm surrounding erythema c/w cellulitis.  Psychiatric: Her mood appears anxious.    ED Course  Procedures (including critical care time)   INCISION AND DRAINAGE Performed by: Burgess Amor Consent: Verbal consent obtained. Risks and benefits: risks, benefits and alternatives were discussed Type: abscess  Body area: abdomen  Anesthesia: local infiltration  Incision was made with a scalpel.  Local anesthetic: lidocaine 2% without epinephrine  Anesthetic total: 3 ml  Complexity: simple Blunt dissection to break up loculations  Drainage: purulent  Drainage amount: small  Packing material: not amenable to packing,  Superficial  pustule with no true cavity for packing  Patient tolerance: Patient tolerated the procedure well with no immediate complications.     Labs Reviewed  CULTURE, ROUTINE-ABSCESS   No results found.   1. Abscess       MDM  Simple abscess with scant surrounding erythema c/w cellulitis. Patient tolerated procedure well.  She was placed on doxycycline,  Encouraged warm soaks.  lortab elixer prescribed for pain as pt reports does not like swallowing pills.    Anticipate recheck by pcp this week if not improved.      01/18/13 - during shift today,  Contact from pharmacist to inform that  patient has received hydrocodone x 2 prescriptions this month,  Most recently #25  6 days ago from dentist.  Advised pharmacy not to fill the lortab - she should continue taking her hydrocodone from her dentist if needed for pain relief.    Burgess Amor, PA-C 01/18/13 2138

## 2013-01-19 NOTE — ED Provider Notes (Signed)
Medical screening examination/treatment/procedure(s) were performed by non-physician practitioner and as supervising physician I was immediately available for consultation/collaboration. Faydra Korman, MD, FACEP   Alayah Knouff L Gillian Kluever, MD 01/19/13 0712 

## 2013-01-20 LAB — CULTURE, ROUTINE-ABSCESS

## 2013-01-23 ENCOUNTER — Other Ambulatory Visit: Payer: Self-pay

## 2013-01-23 ENCOUNTER — Emergency Department (HOSPITAL_COMMUNITY): Payer: Medicaid Other

## 2013-01-23 ENCOUNTER — Encounter (HOSPITAL_COMMUNITY): Payer: Self-pay

## 2013-01-23 ENCOUNTER — Inpatient Hospital Stay (HOSPITAL_COMMUNITY)
Admission: EM | Admit: 2013-01-23 | Discharge: 2013-01-27 | DRG: 917 | Disposition: A | Discharge status: Other Acute Inpatient Hospital | Payer: Medicaid Other | Attending: Family Medicine | Admitting: Family Medicine

## 2013-01-23 DIAGNOSIS — Z882 Allergy status to sulfonamides status: Secondary | ICD-10-CM

## 2013-01-23 DIAGNOSIS — T400X1A Poisoning by opium, accidental (unintentional), initial encounter: Principal | ICD-10-CM | POA: Diagnosis present

## 2013-01-23 DIAGNOSIS — T394X2A Poisoning by antirheumatics, not elsewhere classified, intentional self-harm, initial encounter: Secondary | ICD-10-CM | POA: Diagnosis present

## 2013-01-23 DIAGNOSIS — J45909 Unspecified asthma, uncomplicated: Secondary | ICD-10-CM | POA: Diagnosis present

## 2013-01-23 DIAGNOSIS — F1910 Other psychoactive substance abuse, uncomplicated: Secondary | ICD-10-CM

## 2013-01-23 DIAGNOSIS — F418 Other specified anxiety disorders: Secondary | ICD-10-CM

## 2013-01-23 DIAGNOSIS — F411 Generalized anxiety disorder: Secondary | ICD-10-CM | POA: Diagnosis present

## 2013-01-23 DIAGNOSIS — F17200 Nicotine dependence, unspecified, uncomplicated: Secondary | ICD-10-CM | POA: Diagnosis present

## 2013-01-23 DIAGNOSIS — E876 Hypokalemia: Secondary | ICD-10-CM

## 2013-01-23 DIAGNOSIS — N281 Cyst of kidney, acquired: Secondary | ICD-10-CM | POA: Diagnosis present

## 2013-01-23 DIAGNOSIS — F3289 Other specified depressive episodes: Secondary | ICD-10-CM | POA: Diagnosis present

## 2013-01-23 DIAGNOSIS — T43502A Poisoning by unspecified antipsychotics and neuroleptics, intentional self-harm, initial encounter: Secondary | ICD-10-CM | POA: Diagnosis present

## 2013-01-23 DIAGNOSIS — T424X4A Poisoning by benzodiazepines, undetermined, initial encounter: Secondary | ICD-10-CM | POA: Diagnosis present

## 2013-01-23 DIAGNOSIS — J4489 Other specified chronic obstructive pulmonary disease: Secondary | ICD-10-CM

## 2013-01-23 DIAGNOSIS — F329 Major depressive disorder, single episode, unspecified: Secondary | ICD-10-CM | POA: Diagnosis present

## 2013-01-23 DIAGNOSIS — J9600 Acute respiratory failure, unspecified whether with hypoxia or hypercapnia: Secondary | ICD-10-CM | POA: Diagnosis present

## 2013-01-23 DIAGNOSIS — J449 Chronic obstructive pulmonary disease, unspecified: Secondary | ICD-10-CM

## 2013-01-23 DIAGNOSIS — Z79899 Other long term (current) drug therapy: Secondary | ICD-10-CM

## 2013-01-23 LAB — ETHANOL: Alcohol, Ethyl (B): <11 mg/dL (ref 0–11)

## 2013-01-23 LAB — URINALYSIS, ROUTINE W REFLEX MICROSCOPIC
Bilirubin Urine: NEGATIVE
Hgb urine dipstick: NEGATIVE
Protein, ur: NEGATIVE mg/dL
Urobilinogen, UA: 0.2 mg/dL (ref 0.0–1.0)

## 2013-01-23 LAB — COMPREHENSIVE METABOLIC PANEL
AST: 39 U/L — ABNORMAL HIGH (ref 0–37)
CO2: 27 mEq/L (ref 19–32)
Calcium: 8.9 mg/dL (ref 8.4–10.5)
Creatinine, Ser: 0.79 mg/dL (ref 0.50–1.10)
GFR calc non Af Amer: >90 mL/min (ref >90)

## 2013-01-23 LAB — CBC WITH DIFFERENTIAL/PLATELET
Basophils Absolute: 0.1 10*3/uL (ref 0.0–0.1)
Basophils Relative: 1 % (ref 0–1)
Eosinophils Relative: 2 % (ref 0–5)
HCT: 36.1 % (ref 36.0–46.0)
Lymphocytes Relative: 47 % — ABNORMAL HIGH (ref 12–46)
MCHC: 33.5 g/dL (ref 30.0–36.0)
MCV: 93.8 fL (ref 78.0–100.0)
Monocytes Absolute: 0.6 10*3/uL (ref 0.1–1.0)
RDW: 12.8 % (ref 11.5–15.5)

## 2013-01-23 LAB — RAPID URINE DRUG SCREEN, HOSP PERFORMED
Barbiturates: NOT DETECTED
Opiates: POSITIVE — AB
Tetrahydrocannabinol: POSITIVE — AB

## 2013-01-23 LAB — SALICYLATE LEVEL: Salicylate Lvl: 0.4 mg/dL — ABNORMAL LOW (ref 2.8–20.0)

## 2013-01-23 LAB — ACETAMINOPHEN LEVEL: Acetaminophen (Tylenol), Serum: <15 ug/mL (ref 10–30)

## 2013-01-23 MED ORDER — DEXTROSE 5 % IV SOLN
1.0000 g | Freq: Every day | INTRAVENOUS | Status: DC
  Administered 2013-01-24 – 2013-01-26 (×4): 1 g via INTRAVENOUS
  Filled 2013-01-23 (×5): qty 10

## 2013-01-23 MED ORDER — IOHEXOL 300 MG/ML  SOLN
100.0000 mL | Freq: Once | INTRAMUSCULAR | Status: AC | PRN
  Administered 2013-01-23: 100 mL via INTRAVENOUS

## 2013-01-23 MED ORDER — ACETAMINOPHEN 650 MG RE SUPP
650.0000 mg | Freq: Four times a day (QID) | RECTAL | Status: DC | PRN
  Administered 2013-01-23 – 2013-01-24 (×2): 650 mg via RECTAL
  Filled 2013-01-23 (×2): qty 1

## 2013-01-23 MED ORDER — PROPOFOL 10 MG/ML IV EMUL
5.0000 ug/kg/min | INTRAVENOUS | Status: DC
  Administered 2013-01-23 – 2013-01-24 (×2): 50 ug/kg/min via INTRAVENOUS
  Filled 2013-01-23 (×3): qty 100

## 2013-01-23 MED ORDER — CHLORHEXIDINE GLUCONATE 0.12 % MT SOLN
15.0000 mL | Freq: Two times a day (BID) | OROMUCOSAL | Status: DC
  Administered 2013-01-23 – 2013-01-25 (×4): 15 mL via OROMUCOSAL
  Filled 2013-01-23 (×4): qty 15

## 2013-01-23 MED ORDER — SUCCINYLCHOLINE CHLORIDE 20 MG/ML IJ SOLN
100.0000 mg | Freq: Once | INTRAMUSCULAR | Status: AC
  Administered 2013-01-23: 100 mg via INTRAVENOUS

## 2013-01-23 MED ORDER — BIOTENE DRY MOUTH MT LIQD
15.0000 mL | Freq: Four times a day (QID) | OROMUCOSAL | Status: DC
  Administered 2013-01-24 – 2013-01-25 (×5): 15 mL via OROMUCOSAL

## 2013-01-23 MED ORDER — ETOMIDATE 2 MG/ML IV SOLN
20.0000 mg | Freq: Once | INTRAVENOUS | Status: AC
  Administered 2013-01-23: 20 mg via INTRAVENOUS

## 2013-01-23 MED ORDER — ENOXAPARIN SODIUM 40 MG/0.4ML ~~LOC~~ SOLN
40.0000 mg | SUBCUTANEOUS | Status: DC
  Administered 2013-01-23 – 2013-01-26 (×4): 40 mg via SUBCUTANEOUS
  Filled 2013-01-23 (×4): qty 0.4

## 2013-01-23 MED ORDER — PANTOPRAZOLE SODIUM 40 MG IV SOLR
40.0000 mg | INTRAVENOUS | Status: DC
  Administered 2013-01-23 – 2013-01-24 (×2): 40 mg via INTRAVENOUS
  Filled 2013-01-23 (×2): qty 40

## 2013-01-23 MED ORDER — SODIUM CHLORIDE 0.9 % IV SOLN
INTRAVENOUS | Status: DC
  Administered 2013-01-24 – 2013-01-27 (×7): via INTRAVENOUS

## 2013-01-23 NOTE — ED Notes (Signed)
Dr. Preston Fleeting attempting to intubate.  bp 152/123, hr 79, 100% on NRB.

## 2013-01-23 NOTE — ED Notes (Signed)
Dr. Preston Fleeting intubated patient again with size 7.5cm tube, positive color change on co2 detector, bilateral breath sounds auscultated.  HR 79 02 sat 99% , bp 134/92

## 2013-01-23 NOTE — ED Notes (Signed)
EMS reports [place] was at jail and became unresponsive.  EMS found pt unresposive, ems administered 2mg  narcan IV.  PT immediately set up in bed then laid back down and was unresponsive.  EMS administered a second dose of 2mg  narcan with no response.  EMS report pt had periods of sinus arrest.  HR 80-40, RR 14.  RR prior to narcan was 6.  Pt had been arrested prior to becoming unresponsive for driving under the influence with child in the car.  EMS put 18g in R ac. cbg 102

## 2013-01-23 NOTE — ED Notes (Signed)
Dr. Preston Fleeting intubated with size 7.5cm ett, breath sounds auscultated by Dr. Preston Fleeting.  No color change on co2 detector.  HR, 47, 02 sat 80%.  Dr. Preston Fleeting removed tube and respiratory bagging pt.  EDP will attempt intubation again.
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
## 2013-01-23 NOTE — ED Notes (Signed)
Pt with episodes of bradycardia while in CT, down to 29 noted, Dr. Preston Fleeting made aware.

## 2013-01-24 ENCOUNTER — Inpatient Hospital Stay (HOSPITAL_COMMUNITY): Payer: Medicaid Other

## 2013-01-24 LAB — CBC
HCT: 35.3 % — ABNORMAL LOW (ref 36.0–46.0)
Hemoglobin: 11.6 g/dL — ABNORMAL LOW (ref 12.0–15.0)
MCH: 30.8 pg (ref 26.0–34.0)
MCHC: 32.9 g/dL (ref 30.0–36.0)
MCV: 93.6 fL (ref 78.0–100.0)
Platelets: 291 K/uL (ref 150–400)
RBC: 3.77 MIL/uL — ABNORMAL LOW (ref 3.87–5.11)
RDW: 12.9 % (ref 11.5–15.5)
WBC: 11.2 K/uL — ABNORMAL HIGH (ref 4.0–10.5)

## 2013-01-24 LAB — BLOOD GAS, ARTERIAL
Acid-Base Excess: 0.9 mmol/L (ref 0.0–2.0)
Bicarbonate: 25 mEq/L — ABNORMAL HIGH (ref 20.0–24.0)
Drawn by: 23534
O2 Content: 45 L/min
PEEP: 5 cmH2O
RATE: 14 resp/min
pCO2 arterial: 40 mmHg (ref 35.0–45.0)
pO2, Arterial: 124 mmHg — ABNORMAL HIGH (ref 80.0–100.0)

## 2013-01-24 LAB — BASIC METABOLIC PANEL
CO2: 26 mEq/L (ref 19–32)
Calcium: 9.1 mg/dL (ref 8.4–10.5)
Creatinine, Ser: 0.81 mg/dL (ref 0.50–1.10)
GFR calc non Af Amer: >90 mL/min (ref >90)
Sodium: 142 mEq/L (ref 135–145)

## 2013-01-24 LAB — MAGNESIUM: Magnesium: 1.9 mg/dL (ref 1.5–2.5)

## 2013-01-24 LAB — AMMONIA: Ammonia: 50 umol/L (ref 11–60)

## 2013-01-24 LAB — RAPID URINE DRUG SCREEN, HOSP PERFORMED: Opiates: POSITIVE — AB

## 2013-01-24 MED ORDER — PHENOL 1.4 % MT LIQD
2.0000 | OROMUCOSAL | Status: DC | PRN
  Filled 2013-01-24: qty 177

## 2013-01-24 MED ORDER — TRAZODONE HCL 50 MG PO TABS
50.0000 mg | ORAL_TABLET | Freq: Every evening | ORAL | Status: DC | PRN
  Administered 2013-01-24 – 2013-01-26 (×3): 50 mg via ORAL
  Filled 2013-01-24 (×3): qty 1

## 2013-01-24 MED ORDER — TRAMADOL HCL 50 MG PO TABS
50.0000 mg | ORAL_TABLET | Freq: Two times a day (BID) | ORAL | Status: DC | PRN
  Administered 2013-01-24 – 2013-01-27 (×7): 50 mg via ORAL
  Filled 2013-01-24 (×7): qty 1

## 2013-01-24 MED ORDER — SODIUM CHLORIDE 0.9 % IV BOLUS (SEPSIS)
300.0000 mL | Freq: Once | INTRAVENOUS | Status: AC
  Administered 2013-01-24: 300 mL via INTRAVENOUS

## 2013-01-24 MED ORDER — ACETAMINOPHEN 325 MG PO TABS
650.0000 mg | ORAL_TABLET | ORAL | Status: DC | PRN
  Administered 2013-01-24 – 2013-01-27 (×6): 650 mg via ORAL
  Filled 2013-01-24 (×6): qty 2

## 2013-01-24 NOTE — Progress Notes (Signed)
UR chart review completed.  

## 2013-01-24 NOTE — Progress Notes (Signed)
NAMEELEANORE, [name]             ACCOUNT NO.:  [id number]  MEDICAL RECORD NO.:  [id number]  LOCATION:                                 FACILITY:  PHYSICIAN:  [name], MDDATE OF BIRTH:  June 17, 1976  DATE OF PROCEDURE:  01/24/2013 DATE OF DISCHARGE:                                PROGRESS NOTE   ADDENDUM:  Drug screen now seen on computer reveals positivity for benzodiazepines, opiates, cocaine and THC.  Alcohol level less than 11.     [name], MD     RMD/MEDQ  D:  01/24/2013  T:  01/24/2013  Job:  [id number]

## 2013-01-24 NOTE — H&P (Signed)
[id number] 

## 2013-01-24 NOTE — Progress Notes (Signed)
INITIAL NUTRITION ASSESSMENT  DOCUMENTATION CODES Per approved criteria  -Not Applicable   INTERVENTION: RD will follow for nutrition needs  NUTRITION DIAGNOSIS: Inadequate oral intake related to inability to eat as evidenced by NPO status.  Goal: Pt to meet >/= 90% of their estimated nutrition needs  Monitor:  Nutrition support measures, diet advancement, labs   Reason for Assessment: mechanical ventilation   37 y.o. female  Admitting Dx: Drug overdose  ASSESSMENT: RD drawn to pt due to vent status. Chart reviewed. She is now extubated. Remains NPO at this time. Will follow for diet advancement and to monitor adequacy of po intake.   Height: Ht Readings from Last 1 Encounters:  01/23/13 5\' 3"  (1.6 m)    Weight: Wt Readings from Last 1 Encounters:  01/23/13 164 lb 0.4 oz (74.4 kg)    Ideal Body Weight: 115# (52.2 kg)  % Ideal Body Weight: 142%  Wt Readings from Last 10 Encounters:  01/23/13 164 lb 0.4 oz (74.4 kg)  01/17/13 150 lb (68.04 kg)  12/14/12 149 lb (67.586 kg)  10/15/12 150 lb (68.04 kg)  08/01/12 150 lb (68.04 kg)  11/29/11 160 lb (72.576 kg)  08/17/11 162 lb 6.4 oz (73.664 kg)  07/12/11 171 lb (77.565 kg)  07/03/11 171 lb (77.565 kg)  06/17/11 172 lb 6.4 oz (78.2 kg)    Usual Body Weight: 150-160#  % Usual Body Weight: 102%  BMI:  Body mass index is 29.06 kg/(m^2).Overweight  Estimated Nutritional Needs: Kcal: 1560-1716 Protein: 78-88 gr Fluid: > 1700 ml/day  Skin: Incision to right groin  Diet Order:    EDUCATION NEEDS: -Education not appropriate at this time   Intake/Output Summary (Last 24 hours) at 01/24/13 0929 Last data filed at 01/24/13 0907  Gross per 24 hour  Intake 1757.54 ml  Output    900 ml  Net 857.54 ml    Last BM: PTA  Labs:   Recent Labs Lab 01/23/13 1520 01/24/13 0500  NA 137 142  K 3.9 3.5  CL 101 107  CO2 27 26  BUN 6 7  CREATININE 0.79 0.81  CALCIUM 8.9 9.1  MG  --  1.9  GLUCOSE 104*  100*    CBG (last 3)  No results found for this basename: GLUCAP,  in the last 72 hours  Scheduled Meds: . antiseptic oral rinse  15 mL Mouth Rinse QID  . cefTRIAXone (ROCEPHIN) IVPB 1 gram/50 mL D5W  1 g Intravenous QHS  . chlorhexidine  15 mL Mouth Rinse BID  . enoxaparin (LOVENOX) injection  40 mg Subcutaneous Q24H  . pantoprazole (PROTONIX) IV  40 mg Intravenous Q24H    Continuous Infusions: . sodium chloride 100 mL/hr at 01/24/13 0900  . propofol Stopped (01/24/13 0810)    Past Medical History  Diagnosis Date  . Depression   . Cyst of kidney, acquired     Past Surgical History  Procedure Laterality Date  . Ovarian cyst removal    . Abdominal hysterectomy    . Cesarean section      Royann Shivers MS,RD,LDN,CSG Office: [phone] Pager: [phone]
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
## 2013-01-24 NOTE — Progress Notes (Signed)
[id number]  

## 2013-01-24 NOTE — Clinical Social Work Note (Signed)
Patient recently extubated, not ready for CSW assessment at this time.  Will recheck later this AM to see if assessment can be completed.  Santa Genera, LCSW Clinical Social Worker [phone])
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
## 2013-01-24 NOTE — Procedures (Signed)
Extubation Procedure Note  Patient Details:   Name: [name] DOB: 1976/01/09 MRN: [id number]   Airway Documentation:  Airway (Active)  Secured at (cm) 23 cm 01/24/2013  7:45 AM  Measured From Lips 01/24/2013  7:45 AM  Secured Location Right 01/24/2013  7:45 AM  Secured By [employer] 01/24/2013  7:45 AM  Tube Holder Repositioned Yes 01/24/2013  7:15 AM  Cuff Pressure (cm H2O) 24 cm H2O 01/24/2013  1:55 AM  Site Condition Dry 01/24/2013  7:45 AM    Evaluation  O2 sats: stable throughout Complications: No apparent complications Patient did tolerate procedure well. Bilateral Breath Sounds: Diminished Suctioning: Oral Yes Patient weaned on cpap 5/5 40% for 40 minutes.  Pt did well, NIF -40, VC 1.7 Liters.  Order received from Dr. Juanetta Gosling to extubate.  Patient did well, pt on 3 lpm Nasal cannula.  Sats are 100%.  Will continue to monitor. Jerelyn Scott Billingsley 01/24/2013, 9:38 AM

## 2013-01-25 LAB — BASIC METABOLIC PANEL
BUN: 4 mg/dL — ABNORMAL LOW (ref 6–23)
CO2: 25 mEq/L (ref 19–32)
Calcium: 8.5 mg/dL (ref 8.4–10.5)
Chloride: 109 mEq/L (ref 96–112)
Creatinine, Ser: 0.68 mg/dL (ref 0.50–1.10)
Glucose, Bld: 101 mg/dL — ABNORMAL HIGH (ref 70–99)

## 2013-01-25 LAB — HEPATIC FUNCTION PANEL
ALT: 15 U/L (ref 0–35)
Alkaline Phosphatase: 103 U/L (ref 39–117)
Total Bilirubin: 0.2 mg/dL — ABNORMAL LOW (ref 0.3–1.2)
Total Protein: 6.1 g/dL (ref 6.0–8.3)

## 2013-01-25 LAB — TSH: TSH: 0.844 u[IU]/mL (ref 0.350–4.500)

## 2013-01-25 MED ORDER — POTASSIUM CHLORIDE 10 MEQ/100ML IV SOLN
10.0000 meq | INTRAVENOUS | Status: AC
  Administered 2013-01-25 (×4): 10 meq via INTRAVENOUS
  Filled 2013-01-25: qty 100
  Filled 2013-01-25: qty 200
  Filled 2013-01-25: qty 100

## 2013-01-25 MED ORDER — PANTOPRAZOLE SODIUM 40 MG PO TBEC
40.0000 mg | DELAYED_RELEASE_TABLET | Freq: Every day | ORAL | Status: DC
  Administered 2013-01-25 – 2013-01-26 (×2): 40 mg via ORAL
  Filled 2013-01-25 (×2): qty 1

## 2013-01-25 NOTE — Progress Notes (Signed)
Notified Dr. Janna Arch about pts HR sustaining in the low to mid 40's for approximately an hour. Patient is asymptomatic at this time. MD ordered just to continue to monitor patient at this time.

## 2013-01-25 NOTE — Progress Notes (Signed)
The patient is receiving Protonix by the intravenous route.  Based on criteria approved by the Pharmacy and Therapeutics Committee and the Medical Executive Committee, the medication is being converted to the equivalent oral dose form.  These criteria include: -No Active GI bleeding -Able to tolerate diet of full liquids (or better) or tube feeding OR able to tolerate other medications by the oral or enteral route  If you have any questions about this conversion, please contact the Pharmacy Department (ext [phone]).  Thank you.  Wayland Denis, [facility] 01/25/2013 3:20 PM

## 2013-01-25 NOTE — Progress Notes (Signed)
Tommy with ACT team notified of pt consult. He will be in after lunchtime

## 2013-01-25 NOTE — Progress Notes (Signed)
[name], [name]             ACCOUNT NO.:  [id number]  MEDICAL RECORD NO.:  [id number]  LOCATION:  IC07                          FACILITY:  [facility]  PHYSICIAN:  [name], MDDATE OF BIRTH:  1976-07-25  DATE OF PROCEDURE: DATE OF DISCHARGE:                                PROGRESS NOTE   The patient is status post overdose with primarily Vicodin.  She took 15, 5 mg tablets.  Extubated today.  Also had TAC prescribed benzos from Dr. Cecelia Byars psychiatrist and cocaine in her blood.  A long talk with the patient.  She says this is a new beginning.  Has a low potassium of 3.0. Renal function is fine.  VITAL SIGNS:  Blood pressure is 119/75, temperature 99.2, pulse 79 and regular, respiratory rate is 17. LUNGS:  Prolonged expiratory phase.  Scattered rhonchi.  No rales.  No wheeze. HEART:  Regular rhythm.  No murmurs, gallops, or rubs. NEUROLOGIC:  Cranial nerves grossly intact.  Alert and oriented.  Plan right now is to give KCl 10 mEq x4, eat, had a bed to chair, walk with assistance.  ACT team to see patient and we will probably discharge in the morning when she is more medically stable.     [name], MD     RMD/MEDQ  D:  01/25/2013  T:  01/25/2013  Job:  [id number]

## 2013-01-25 NOTE — Progress Notes (Signed)
She was able to be extubated yesterday from a respiratory status she is doing well. She continues to be somewhat drowsy. I will plan to sign off at this point. I would plan to continue treatment for presumed aspiration. When she switches to by mouth something like Augmentin 875 mg twice a day for 7-10 days would be appropriate  Thanks for allow me to see her with you I will be glad to be more active in her care if needed
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
## 2013-01-26 LAB — HEPATIC FUNCTION PANEL
Total Bilirubin: 0.1 mg/dL — ABNORMAL LOW (ref 0.3–1.2)
Total Protein: 5.8 g/dL — ABNORMAL LOW (ref 6.0–8.3)

## 2013-01-26 LAB — BASIC METABOLIC PANEL
BUN: 6 mg/dL (ref 6–23)
Calcium: 8.6 mg/dL (ref 8.4–10.5)
Chloride: 111 mEq/L (ref 96–112)
Creatinine, Ser: 0.73 mg/dL (ref 0.50–1.10)
GFR calc Af Amer: >90 mL/min (ref >90)

## 2013-01-26 MED ORDER — CITALOPRAM HYDROBROMIDE 20 MG PO TABS
20.0000 mg | ORAL_TABLET | Freq: Every day | ORAL | Status: DC
  Administered 2013-01-26 – 2013-01-27 (×2): 20 mg via ORAL
  Filled 2013-01-26 (×2): qty 1
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
## 2013-01-26 NOTE — Progress Notes (Signed)
[id number] 
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
## 2013-01-26 NOTE — BHH Counselor (Signed)
[name], ACT counselor at [facility], submitted Pt for admission to [facility]. [name], [facility] confirmed bed availability. Donell Sievert, PA reviewed clinical information and accepted Pt to the service of Dr. Henrietta Dine, room [phone]. Communicated this information to Hattie Perch, ACT counselor at [facility].  Harlin Rain Patsy Baltimore, LPC, [facility] Assessment Counselor

## 2013-01-26 NOTE — Progress Notes (Signed)
Reported to L.Schonewitz, RN on Dept. 300, transferred to room 330 in stable condition with sitter.

## 2013-01-27 ENCOUNTER — Encounter (HOSPITAL_COMMUNITY): Payer: Self-pay

## 2013-01-27 ENCOUNTER — Inpatient Hospital Stay (HOSPITAL_COMMUNITY)
Admission: AD | Admit: 2013-01-27 | Discharge: 2013-02-03 | DRG: 885 | Disposition: A | Discharge status: Home / Self Care | Payer: MEDICAID | Source: Intra-hospital | Attending: Psychiatry | Admitting: Psychiatry

## 2013-01-27 DIAGNOSIS — Z79899 Other long term (current) drug therapy: Secondary | ICD-10-CM

## 2013-01-27 DIAGNOSIS — F411 Generalized anxiety disorder: Secondary | ICD-10-CM | POA: Diagnosis present

## 2013-01-27 DIAGNOSIS — F314 Bipolar disorder, current episode depressed, severe, without psychotic features: Principal | ICD-10-CM | POA: Diagnosis present

## 2013-01-27 DIAGNOSIS — F1910 Other psychoactive substance abuse, uncomplicated: Secondary | ICD-10-CM | POA: Diagnosis present

## 2013-01-27 LAB — HEPATIC FUNCTION PANEL
ALT: 14 U/L (ref 0–35)
AST: 11 U/L (ref 0–37)
Albumin: 2.7 g/dL — ABNORMAL LOW (ref 3.5–5.2)
Alkaline Phosphatase: 97 U/L (ref 39–117)
Total Bilirubin: 0.2 mg/dL — ABNORMAL LOW (ref 0.3–1.2)

## 2013-01-27 LAB — BASIC METABOLIC PANEL
BUN: 6 mg/dL (ref 6–23)
Calcium: 8.3 mg/dL — ABNORMAL LOW (ref 8.4–10.5)
Creatinine, Ser: 0.76 mg/dL (ref 0.50–1.10)
GFR calc non Af Amer: >90 mL/min (ref >90)
Glucose, Bld: 116 mg/dL — ABNORMAL HIGH (ref 70–99)
Sodium: 138 mEq/L (ref 135–145)

## 2013-01-27 MED ORDER — CITALOPRAM HYDROBROMIDE 20 MG PO TABS
20.0000 mg | ORAL_TABLET | Freq: Every day | ORAL | Status: DC
  Administered 2013-01-28 – 2013-02-03 (×7): 20 mg via ORAL
  Filled 2013-01-27: qty 3
  Filled 2013-01-27 (×5): qty 1
  Filled 2013-01-27: qty 3
  Filled 2013-01-27 (×2): qty 1

## 2013-01-27 MED ORDER — AMOXICILLIN-POT CLAVULANATE 875-125 MG PO TABS
1.0000 | ORAL_TABLET | Freq: Two times a day (BID) | ORAL | Status: DC
  Administered 2013-01-27: 1 via ORAL
  Filled 2013-01-27: qty 1

## 2013-01-27 MED ORDER — AMOXICILLIN-POT CLAVULANATE 875-125 MG PO TABS: 1.0000 | ORAL_TABLET | Freq: Two times a day (BID) | ORAL | Status: DC

## 2013-01-27 MED ORDER — TRAZODONE HCL 50 MG PO TABS: 50.0000 mg | ORAL_TABLET | Freq: Every evening | ORAL | Status: DC | PRN

## 2013-01-27 MED ORDER — POTASSIUM CHLORIDE CRYS ER 20 MEQ PO TBCR
20.0000 meq | EXTENDED_RELEASE_TABLET | Freq: Every day | ORAL | Status: DC
  Administered 2013-01-27: 20 meq via ORAL
  Filled 2013-01-27: qty 1

## 2013-01-27 MED ORDER — TRAMADOL HCL 50 MG PO TABS
50.0000 mg | ORAL_TABLET | Freq: Two times a day (BID) | ORAL | Status: DC | PRN
  Administered 2013-01-27 – 2013-02-03 (×7): 50 mg via ORAL
  Filled 2013-01-27 (×8): qty 1

## 2013-01-27 MED ORDER — MAGNESIUM HYDROXIDE 400 MG/5ML PO SUSP: 30.0000 mL | Freq: Every day | ORAL | Status: DC | PRN

## 2013-01-27 MED ORDER — PANTOPRAZOLE SODIUM 40 MG PO TBEC
40.0000 mg | DELAYED_RELEASE_TABLET | Freq: Every day | ORAL | Status: DC
  Administered 2013-01-27 – 2013-02-02 (×7): 40 mg via ORAL
  Filled 2013-01-27 (×10): qty 1

## 2013-01-27 MED ORDER — ALBUTEROL SULFATE HFA 108 (90 BASE) MCG/ACT IN AERS: 2.0000 | INHALATION_SPRAY | Freq: Four times a day (QID) | RESPIRATORY_TRACT | Status: DC

## 2013-01-27 MED ORDER — ALBUTEROL SULFATE HFA 108 (90 BASE) MCG/ACT IN AERS
2.0000 | INHALATION_SPRAY | Freq: Four times a day (QID) | RESPIRATORY_TRACT | Status: DC
  Administered 2013-01-27: 2 via RESPIRATORY_TRACT
  Filled 2013-01-27: qty 6.7

## 2013-01-27 MED ORDER — ALBUTEROL SULFATE HFA 108 (90 BASE) MCG/ACT IN AERS
2.0000 | INHALATION_SPRAY | Freq: Four times a day (QID) | RESPIRATORY_TRACT | Status: DC | PRN
  Administered 2013-01-28 – 2013-02-01 (×2): 2 via RESPIRATORY_TRACT
  Filled 2013-01-27: qty 6.7

## 2013-01-27 MED ORDER — POTASSIUM CHLORIDE CRYS ER 20 MEQ PO TBCR: 20.0000 meq | EXTENDED_RELEASE_TABLET | Freq: Every day | ORAL | Status: DC

## 2013-01-27 MED ORDER — AMOXICILLIN-POT CLAVULANATE 875-125 MG PO TABS
1.0000 | ORAL_TABLET | Freq: Two times a day (BID) | ORAL | Status: DC
  Administered 2013-01-27 – 2013-02-03 (×14): 1 via ORAL
  Filled 2013-01-27: qty 9
  Filled 2013-01-27 (×7): qty 1
  Filled 2013-01-27: qty 9
  Filled 2013-01-27 (×3): qty 1
  Filled 2013-01-27: qty 9
  Filled 2013-01-27 (×2): qty 1
  Filled 2013-01-27: qty 9
  Filled 2013-01-27 (×3): qty 1

## 2013-01-27 MED ORDER — CITALOPRAM HYDROBROMIDE 20 MG PO TABS: 20.0000 mg | ORAL_TABLET | Freq: Every day | ORAL | Status: DC

## 2013-01-27 MED ORDER — POTASSIUM CHLORIDE CRYS ER 20 MEQ PO TBCR
20.0000 meq | EXTENDED_RELEASE_TABLET | Freq: Every day | ORAL | Status: DC
  Administered 2013-01-28 – 2013-02-03 (×7): 20 meq via ORAL
  Filled 2013-01-27 (×4): qty 1
  Filled 2013-01-27: qty 3
  Filled 2013-01-27: qty 1
  Filled 2013-01-27: qty 3
  Filled 2013-01-27 (×2): qty 1

## 2013-01-27 MED ORDER — ALUM & MAG HYDROXIDE-SIMETH 200-200-20 MG/5ML PO SUSP: 30.0000 mL | ORAL | Status: DC | PRN

## 2013-01-27 MED ORDER — ACETAMINOPHEN 325 MG PO TABS: 650.0000 mg | ORAL_TABLET | Freq: Four times a day (QID) | ORAL | Status: DC | PRN

## 2013-01-27 NOTE — Progress Notes (Signed)
Called report to Berneice Heinrich, RN at [facility].  Verbalized understanding.  Pt dc'd to facility via [facility].  Schonewitz, Candelaria Stagers 01/27/2013

## 2013-01-27 NOTE — Tx Team (Signed)
Initial Interdisciplinary Treatment Plan  PATIENT STRENGTHS: (choose at least two) Communication skills General fund of knowledge Motivation for treatment/growth  PATIENT STRESSORS: Financial difficulties Legal issue Substance abuse   PROBLEM LIST: Problem List/Patient Goals Date to be addressed Date deferred Reason deferred Estimated date of resolution  Substance Abuse      Anxiety      Suicidal Ideations                                           DISCHARGE CRITERIA:  Ability to meet basic life and health needs Need for constant or close observation no longer present  PRELIMINARY DISCHARGE PLAN: Attend aftercare/continuing care group Attend 12-step recovery group  PATIENT/FAMIILY INVOLVEMENT: This treatment plan has been presented to and reviewed with the patient, [name], and/or family member, .  The patient and family have been given the opportunity to ask questions and make suggestions.  Noah Charon 01/27/2013, 6:25 PM

## 2013-01-27 NOTE — Discharge Summary (Signed)
[id number] 

## 2013-01-27 NOTE — Progress Notes (Signed)
[name], [name]             ACCOUNT NO.:  [id number]  MEDICAL RECORD NO.:  [id number]  LOCATION:  A330                          FACILITY:  [facility]  PHYSICIAN:  [name], MDDATE OF BIRTH:  1976-06-05  DATE OF PROCEDURE: DATE OF DISCHARGE:                                PROGRESS NOTE   SUBJECTIVE:  The patient is status post ventilator from overdose of liquid Vicodin and Xanax.  In her system, which was prescribed a THC as well as opiates.  The patient is currently awake and alert, has hoarseness of voice.  Question of asthmatic bronchitis and aspiration going on.  Currently, on IV Rocephin.  Pulmonology has signed off and recommended p.o. Augmentin.  The patient denied she was incarcerated for which she was brought in by the police, not sure of the circumstance, but we will find this out.  I have asked psych to come and evaluate her for inpatient psych as she had life-threatening overdose, and they will come back and see the patient.  Potassium is normal, creatinine 0.73.  OBJECTIVE:  VITAL SIGNS:  Blood pressure 134/68, temperature 98.7, pulse 52 and regular, respiratory rate is 16. LUNGS:  Mild inspiratory and expiratory wheezes with scattered rhonchi. No rales audible. HEART:  Regular rhythm.  No S3 or S4.  No heaves, thrills, or rubs. ABDOMEN:  Soft, nontender.  Bowel sounds normoactive.  IMPRESSION AND PLAN:  She also is on positive for cocaine in her urine drug screen.  Concern right now, she had a life-threatening overdose requiring ventilator.  I would like to be evaluated for inpatient psych to see, if she is a danger to herself, and then we will continue IV/p.o. antibiotics for a period of 7-10 days for presumed aspiration for endotracheal intubation also as well as possible aspiration.  We will await psychiatric disposition and then refer to police for their disposition whether or not she is incarcerated.     [name],  MD     RMD/MEDQ  D:  01/26/2013  T:  01/27/2013  Job:  [id number]
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
## 2013-01-27 NOTE — Progress Notes (Signed)
New admit; pt attended Karaoke group; listened attentively but did not participate.

## 2013-01-28 ENCOUNTER — Encounter (HOSPITAL_COMMUNITY): Payer: Self-pay | Admitting: Psychiatry

## 2013-01-28 DIAGNOSIS — F411 Generalized anxiety disorder: Secondary | ICD-10-CM | POA: Diagnosis present

## 2013-01-28 DIAGNOSIS — F332 Major depressive disorder, recurrent severe without psychotic features: Secondary | ICD-10-CM

## 2013-01-28 DIAGNOSIS — F1910 Other psychoactive substance abuse, uncomplicated: Secondary | ICD-10-CM | POA: Diagnosis present

## 2013-01-28 DIAGNOSIS — F314 Bipolar disorder, current episode depressed, severe, without psychotic features: Principal | ICD-10-CM | POA: Diagnosis present

## 2013-01-28 LAB — CULTURE, BLOOD (ROUTINE X 2)
Culture: NO GROWTH
Culture: NO GROWTH
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
## 2013-01-28 NOTE — [facility] Group Notes (Signed)
[facility] LCSW Group Therapy  01/28/2013 1:15 PM  Type of Therapy:  Group Therapy  Participation Level:  Active  Participation Quality:  Appropriate and Attentive  Affect:  Appropriate  Cognitive:  Alert and Appropriate  Insight:  Developing/Improving and Engaged  Engagement in Therapy:  Developing/Improving and Engaged  Modes of Intervention:  Clarification, Confrontation, Discussion, Education, Exploration, Limit-setting, Orientation, Problem-solving, Rapport Building, Dance movement psychotherapist, Socialization and Support  Summary of Progress/Problems: The topic for today was feelings about relapse.  Pt discussed what relapse prevention is to them and identified triggers that they are on the path to relapse.  Pt processed their feeling towards relapse and was able to relate to peers.  Pt discussed coping skills that can be used for relapse prevention.   Pt participated in group discussion about putting yourself first in order to take better care of themselves.  With this discussion, pt discussed learning to say no to others and self care.  Pt discussed goals they had to get to where they want to be.  Pt was able to process how she helps everyone else out but when she needed help, no one was there for her.  Pt actively listened to group discussion.    Carmina Miller 01/28/2013, 2:00 PM

## 2013-01-28 NOTE — Progress Notes (Signed)
Adult Psychoeducational Group Note  Date:  01/28/2013 Time:  11:13 AM  Group Topic/Focus:  Making Healthy Choices:   The focus of this group is to help patients identify negative/unhealthy choices they were using prior to admission and identify positive/healthier coping strategies to replace them upon discharge.  Participation Level:  Minimal  Participation Quality:  Attentive  Affect:  Depressed and Flat  Cognitive:  Oriented  Insight: Improving  Engagement in Group:  Limited  Modes of Intervention:  Activity and Education  Additional Comments:  Pt is here for OD and looking to get better  Chanda Laperle T 01/28/2013, 11:13 AM

## 2013-01-28 NOTE — Progress Notes (Signed)
Patient ID: [name], female   DOB: Mar 14, 1976, 36 y.o.   MRN: [id number]  D: Patient pleasant on approach tonight. Reports her body hurting all over and throat scratchy from the ventilator tube. Patient attended Karaoke group tonight. Got new admission orders and physician continued what the patient was on in the hospital. Reports they told her she has pneumonia and chest x-ray did indicate some atelectasis. She had aspirated at one point after overdose. Patient slowed and flat at present but no SI.  A: Staff will monitor on q 15 minute checks, follow treatment plan, and give meds as ordered. R: Taking meds without issue tonight. No distress at present.
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
## 2013-01-29 DIAGNOSIS — F319 Bipolar disorder, unspecified: Secondary | ICD-10-CM

## 2013-01-29 MED ORDER — TRAZODONE HCL 100 MG PO TABS
100.0000 mg | ORAL_TABLET | Freq: Every evening | ORAL | Status: DC | PRN
  Administered 2013-01-30 – 2013-02-02 (×4): 100 mg via ORAL
  Filled 2013-01-29: qty 1
  Filled 2013-01-29: qty 3
  Filled 2013-01-29 (×5): qty 1

## 2013-01-29 MED ORDER — LORAZEPAM 1 MG PO TABS
1.0000 mg | ORAL_TABLET | Freq: Four times a day (QID) | ORAL | Status: DC | PRN
  Administered 2013-01-29: 1 mg via ORAL
  Filled 2013-01-29: qty 1

## 2013-01-29 MED ORDER — LORAZEPAM 1 MG PO TABS
ORAL_TABLET | ORAL | Status: AC
  Administered 2013-01-29: 15:00:00
  Filled 2013-01-29: qty 1

## 2013-01-29 MED ORDER — RISPERIDONE 0.5 MG PO TABS
0.5000 mg | ORAL_TABLET | Freq: Three times a day (TID) | ORAL | Status: DC | PRN
  Administered 2013-01-29 – 2013-01-30 (×3): 0.5 mg via ORAL
  Filled 2013-01-29 (×3): qty 1

## 2013-01-29 NOTE — Progress Notes (Signed)
D) Pt approached this writer stating that she was fearful of another Pt and wanted to be moved.  A) Provided Pt with a 1:1 R) Moved Pt to the 300 hall after talking with her.

## 2013-01-29 NOTE — Progress Notes (Signed)
Psychoeducational Group Note  Date: 01/29/2013 Time:  1015  Group Topic/Focus:  Identifying Needs:   The focus of this group is to help patients identify their personal needs that have been historically problematic and identify healthy behaviors to address their needs.  Participation Level:  Did not attend  Dione Housekeeper

## 2013-01-29 NOTE — Progress Notes (Signed)
D) Pt has been agitated stating that "others are talking about me". Is paranoid and hearing voices. A) Given Risperdal .5 mg and  Lorazepam 1 mg. R) Sitting on the bed with her eyes closed presently.

## 2013-01-29 NOTE — Progress Notes (Signed)
Psychoeducational Group Note  Date:  01/29/2013 Time:  0945 am  Group Topic/Focus:  Identifying Needs:   The focus of this group is to help patients identify their personal needs that have been historically problematic and identify healthy behaviors to address their needs.  Participation Level:  Did Not Attend  [name] 01/29/2013,2:09 PM

## 2013-01-29 NOTE — Progress Notes (Signed)
The patient attended the A.A. Meeting on the 300 hallway this evening.  

## 2013-01-29 NOTE — Progress Notes (Signed)
Patient ID: [name], female   DOB: 08/27/1976, 37 y.o.   MRN: [id number] D. The patient has an anxious mood and affect. She spent a lot of time on the phone this evening. Attended the AA/NA group. As soon as she returned to 500 hall after group wanted something for pain. Described her pain as being all over, but mostly in her legs.  A. Met briefly with the patient. Medicated for pain.  R. The patient denies suicidal ideation. 15 minute checks maintained for safety.

## 2013-01-29 NOTE — Progress Notes (Signed)
 .  Psychoeducational Group Note    Date: 01/29/2013 Time:  0930  Goal Setting Purpose of Group: To be able to set a goal that is measurable and that can be accomplished in one day Participation Level:  Did not attend  [name]

## 2013-01-29 NOTE — Progress Notes (Signed)
[facility] MD Progress Note  01/29/2013 3:42 PM Jaicee TRANAE LARAMIE  MRN:  [id number] Subjective:  Cydnee is having a hard time. She is having increased anxiety, agitation. She is reacting with fear to another peer's behavior. She feels that his acting out behavior was directed towards her. Was wanting to be discharged to somewhere else. She also expresses worries, concerns about some other people.  Diagnosis:  Bipolar Disorder, Anxiety Disorder NOS, Substance Abuse  ADL's:  Intact  Sleep: Poor  Appetite:  Poor  Suicidal Ideation:  Plan:  denies Intent:  denies Means:  denies Homicidal Ideation:  Plan:  denies Intent:  denies Means:  denies AEB (as evidenced by):  Psychiatric Specialty Exam: Review of Systems  HENT: Negative.   Eyes: Negative.   Respiratory: Negative.   Cardiovascular: Negative.   Gastrointestinal: Negative.   Genitourinary: Negative.   Musculoskeletal: Positive for myalgias.  Skin: Negative.   Neurological: Positive for tremors and weakness.  Endo/Heme/Allergies: Negative.   Psychiatric/Behavioral: Positive for depression and substance abuse. The patient is nervous/anxious and has insomnia.     Blood pressure 138/84, pulse 64, temperature 97.5 F (36.4 C), temperature source Oral, resp. rate 18, height 5\' 2"  (1.575 m), weight 74.39 kg (164 lb), last menstrual period 06/21/2011, SpO2 100.00%.Body mass index is 29.99 kg/(m^2).  General Appearance: Disheveled  Eye Solicitor::  Fair  Speech:  rapid, fragmented  Volume:  Decreased  Mood:  Anxious and worried, fearful  Affect:  anxious, fearful  Thought Process:  Coherent and Goal Directed  Orientation:  Full (Time, Place, and Person)  Thought Content:  worries, concerns, fears, suspiciousness R/O paranoia  Suicidal Thoughts:  No  Homicidal Thoughts:  No  Memory:  Immediate;   Fair Recent;   Fair Remote;   Fair  Judgement:  Poor  Insight:  Shallow  Psychomotor Activity:  Restlessness  Concentration:  Fair   Recall:  Fair  Akathisia:  No  Handed:  Right  AIMS (if indicated):     Assets:  Desire for Improvement  Sleep:  Number of Hours: 4.75   Current Medications: Current Facility-Administered Medications  Medication Dose Route Frequency Provider Last Rate Last Dose  . acetaminophen (TYLENOL) tablet 650 mg  650 mg Oral Q6H PRN Cleotis Nipper, MD      . albuterol (PROVENTIL HFA;VENTOLIN HFA) 108 (90 BASE) MCG/ACT inhaler 2 puff  2 puff Inhalation Q6H PRN Cleotis Nipper, MD   2 puff at 01/28/13 2151  . alum & mag hydroxide-simeth (MAALOX/MYLANTA) 200-200-20 MG/5ML suspension 30 mL  30 mL Oral Q4H PRN Cleotis Nipper, MD      . amoxicillin-clavulanate (AUGMENTIN) 875-125 MG per tablet 1 tablet  1 tablet Oral BID Cleotis Nipper, MD   1 tablet at 01/29/13 0815  . citalopram (CELEXA) tablet 20 mg  20 mg Oral Daily Cleotis Nipper, MD   20 mg at 01/29/13 0815  . LORazepam (ATIVAN) tablet 1 mg  1 mg Oral Q6H PRN Rachael Fee, MD   1 mg at 01/29/13 1414  . magnesium hydroxide (MILK OF MAGNESIA) suspension 30 mL  30 mL Oral Daily PRN Cleotis Nipper, MD      . pantoprazole (PROTONIX) EC tablet 40 mg  40 mg Oral QHS Cleotis Nipper, MD   40 mg at 01/28/13 2148  . potassium chloride SA (K-DUR,KLOR-CON) CR tablet 20 mEq  20 mEq Oral Daily Cleotis Nipper, MD   20 mEq at 01/29/13 0815  . risperiDONE (RISPERDAL) tablet  0.5 mg  0.5 mg Oral TID PRN Rachael Fee, MD   0.5 mg at 01/29/13 1405  . traMADol (ULTRAM) tablet 50 mg  50 mg Oral BID PRN Cleotis Nipper, MD   50 mg at 01/28/13 2149  . traZODone (DESYREL) tablet 100 mg  100 mg Oral QHS PRN Rachael Fee, MD        Lab Results: No results found for this or any previous visit (from the past 48 hour(s)).  Physical Findings: AIMS: Facial and Oral Movements Muscles of Facial Expression: None, normal Lips and Perioral Area: None, normal Jaw: None, normal Tongue: None, normal,Extremity Movements Upper (arms, wrists, hands, fingers): None, normal Lower (legs, knees,  ankles, toes): None, normal, Trunk Movements Neck, shoulders, hips: None, normal, Overall Severity Severity of abnormal movements (highest score from questions above): None, normal Incapacitation due to abnormal movements: None, normal Patient's awareness of abnormal movements (rate only patient's report): No Awareness, Dental Status Current problems with teeth and/or dentures?: No Does patient usually wear dentures?: No  CIWA:    COWS:     Treatment Plan Summary: Daily contact with patient to assess and evaluate symptoms and progress in treatment Medication management  Plan: Supportive approach/coping skills/relapse prevention           Improve reality testing           Trial with Risperdal/Ativan  Medical Decision Making Problem Points:  Review of psycho-social stressors (1) Data Points:  Review of medication regiment & side effects (2) Review of new medications or change in dosage (2)  I certify that inpatient services furnished can reasonably be expected to improve the patient's condition.   Deion Swift A 01/29/2013, 3:42 PM

## 2013-01-30 MED ORDER — LORAZEPAM 1 MG PO TABS
1.0000 mg | ORAL_TABLET | Freq: Three times a day (TID) | ORAL | Status: DC | PRN
  Administered 2013-01-31 – 2013-02-03 (×3): 1 mg via ORAL
  Filled 2013-01-30 (×3): qty 1

## 2013-01-30 MED ORDER — RISPERIDONE 1 MG PO TABS
1.0000 mg | ORAL_TABLET | Freq: Two times a day (BID) | ORAL | Status: DC
  Administered 2013-01-30 – 2013-02-03 (×8): 1 mg via ORAL
  Filled 2013-01-30 (×2): qty 1
  Filled 2013-01-30 (×2): qty 6
  Filled 2013-01-30 (×3): qty 1
  Filled 2013-01-30 (×2): qty 6
  Filled 2013-01-30 (×4): qty 1

## 2013-01-30 NOTE — Progress Notes (Signed)
Psychoeducational Group Note  Date:  01/30/2013 Time:  0945 am  Group Topic/Focus:  Making Healthy Choices:   The focus of this group is to help patients identify negative/unhealthy choices they were using prior to admission and identify positive/healthier coping strategies to replace them upon discharge.  Participation Level:  Minimal  Participation Quality:  Appropriate  Affect:  Anxious  Cognitive:  Appropriate  Insight:  Developing/Improving  Engagement in Group:  Limited  Additional Comments:    [name] 01/30/2013, 10:29 AM

## 2013-01-30 NOTE — Progress Notes (Signed)
Psychoeducational Group Note  Date: 01/29/2013 Time:  2100  Group Topic/Focus:  wrap up group  Participation Level: Did Not Attend  Participation Quality:  Not Applicable  Affect:  Not Applicable  Cognitive:  Not Applicable  Insight:  Not Applicable  Engagement in Group: Not Applicable  Additional Comments:  Pt was notified that group was beginning. Pt reported having done enough today, "it has taken it out of me today".  Pt remained in her room during group.  Shelah Lewandowsky 01/30/2013, 2:15 AM

## 2013-01-30 NOTE — [facility] Group Notes (Signed)
[facility] LCSW Group Therapy  01/30/2013 10:00-11:00am  Summary of Progress/Problems:  The main focus of today's process group was to consider whether and how to increase/improve support system.  Four definitions of support were discussed and an exercise was utilized to show how much stronger we become with additional supports providing accountability, improved physical and emotional health, and better problem-solving skills.  An emphasis was placed on using counselor, doctor, therapy groups, 12-step groups, and problem-specific support groups to expand supports. The patient expressed little during group and was hesitant to answer questions when called on.  Her motivation to seek additional supports is 7 out of 10.  Type of Therapy:  Group Therapy  Participation Level:  Active  Participation Quality:  Attentive  Affect:  Defensive and Depressed  Cognitive:  Appropriate  Insight:  Developing/Improving  Engagement in Therapy:  Developing/Improving  Modes of Intervention:  Exploration and Motivational Interviewing  Sarina Ser 01/30/2013, 12:34 PM

## 2013-01-30 NOTE — Progress Notes (Signed)
Patient ID: [name], female   DOB: October 28, 1975, 37 y.o.   MRN: [id number] D))  Didn't want to come out of her room this evening, the female peers on the hall had been playing cards and gotten loud, and [name] was sitting in her room.  Stated she could her them talking about her, was paranoid, wondering if she could go home.  Reminded her that she was here for help, and that I couldn't hear them saying anything about her or calling her name as she had said.  Agreed to take risperdal , also said she was having back pain and had tramadol, states she had kidney problems and has appt to see a specialist.  Agreed to hjave a snack, went to bed, currently sleeping. A)  Will continue to monitor for safety, continue POC R)  Safety maintained.

## 2013-01-30 NOTE — Progress Notes (Signed)
D patient slept well last nite, her appetite is improving, her energy level is low and ability to pay attention is improving, depressed 5/10 and hopeless 6/10, WD s/s include diarrhea, denies Si or Hi and ho AVH, taking meds as ordered by MD and interacting more w/peers on the unit, affect depressed and anxious, going to DR for meals and is feeling better than yesterday, no paranoia today, pleasant and cooperative and engaging. A q24min safety checks continue and support offered, POC continues, encouraged to continue attending group and fellowshipping w/group R patient remains safe on the unit

## 2013-01-30 NOTE — Progress Notes (Signed)
[facility] MD Progress Note  01/30/2013 12:45 PM [name]  MRN:  [id number] Subjective:  [name] is more "together" this morning. She is still somewhat labile. She is worried about where to go from here. Thinks she can leave from here today and go to anther place today.  She becomes teary eyed when talking about what is going on out there. Still not very specific. Does not seem to have a lot of insight. (we need to get more collateral information)  Diagnosis:  Bipolar Disorder/ Anxiety Disorder NOS/Substance Abuse  ADL's:  Intact  Sleep: did sleep better last night  Appetite:  Fair  Suicidal Ideation:  Plan:  denies Intent:  denies Means:  denies Homicidal Ideation:  Plan:  denies Intent:  denies Means:  denies AEB (as evidenced by):  Psychiatric Specialty Exam: Review of Systems  Constitutional: Negative.   HENT: Negative.   Eyes: Negative.   Respiratory: Negative.   Cardiovascular: Negative.   Gastrointestinal: Negative.   Genitourinary: Negative.   Musculoskeletal: Negative.   Skin: Negative.   Neurological: Negative.   Endo/Heme/Allergies: Negative.   Psychiatric/Behavioral: Positive for depression and substance abuse. The patient is nervous/anxious and has insomnia.     Blood pressure 120/81, pulse 114, temperature 98.6 F (37 C), temperature source Oral, resp. rate 20, height 5\' 2"  (1.575 m), weight 74.39 kg (164 lb), last menstrual period 06/21/2011, SpO2 100.00%.Body mass index is 29.99 kg/(m^2).  General Appearance: Fairly Groomed  Patent attorney::  Fair  Speech:  Clear and Coherent and Slow  Volume:  Decreased  Mood:  Anxious and worried  Affect:  anxious  Thought Process:  Coherent and Goal Directed  Orientation:  Full (Time, Place, and Person)  Thought Content:  worries, not specific (no spontanous mention of not feeling safe in the unit)  Suicidal Thoughts:  No  Homicidal Thoughts:  No  Memory:  Immediate;   Fair Recent;   Fair Remote;   Fair  Judgement:   Fair  Insight:  superficial  Psychomotor Activity:  Restlessness  Concentration:  Fair  Recall:  Poor  Akathisia:  No  Handed:  Right  AIMS (if indicated):     Assets:  Desire for Improvement  Sleep:  Number of Hours: 5.75   Current Medications: Current Facility-Administered Medications  Medication Dose Route Frequency Provider Last Rate Last Dose  . acetaminophen (TYLENOL) tablet 650 mg  650 mg Oral Q6H PRN Cleotis Nipper, MD      . albuterol (PROVENTIL HFA;VENTOLIN HFA) 108 (90 BASE) MCG/ACT inhaler 2 puff  2 puff Inhalation Q6H PRN Cleotis Nipper, MD   2 puff at 01/28/13 2151  . alum & mag hydroxide-simeth (MAALOX/MYLANTA) 200-200-20 MG/5ML suspension 30 mL  30 mL Oral Q4H PRN Cleotis Nipper, MD      . amoxicillin-clavulanate (AUGMENTIN) 875-125 MG per tablet 1 tablet  1 tablet Oral BID Cleotis Nipper, MD   1 tablet at 01/30/13 0817  . citalopram (CELEXA) tablet 20 mg  20 mg Oral Daily Cleotis Nipper, MD   20 mg at 01/30/13 0817  . LORazepam (ATIVAN) tablet 1 mg  1 mg Oral Q6H PRN Rachael Fee, MD   1 mg at 01/29/13 1414  . magnesium hydroxide (MILK OF MAGNESIA) suspension 30 mL  30 mL Oral Daily PRN Cleotis Nipper, MD      . pantoprazole (PROTONIX) EC tablet 40 mg  40 mg Oral QHS Cleotis Nipper, MD   40 mg at 01/29/13 2207  .  potassium chloride SA (K-DUR,KLOR-CON) CR tablet 20 mEq  20 mEq Oral Daily Cleotis Nipper, MD   20 mEq at 01/30/13 0817  . risperiDONE (RISPERDAL) tablet 0.5 mg  0.5 mg Oral TID PRN Rachael Fee, MD   0.5 mg at 01/29/13 2204  . traMADol (ULTRAM) tablet 50 mg  50 mg Oral BID PRN Cleotis Nipper, MD   50 mg at 01/29/13 2235  . traZODone (DESYREL) tablet 100 mg  100 mg Oral QHS PRN Rachael Fee, MD        Lab Results: No results found for this or any previous visit (from the past 48 hour(s)).  Physical Findings: AIMS: Facial and Oral Movements Muscles of Facial Expression: None, normal Lips and Perioral Area: None, normal Jaw: None, normal Tongue: None,  normal,Extremity Movements Upper (arms, wrists, hands, fingers): None, normal Lower (legs, knees, ankles, toes): None, normal, Trunk Movements Neck, shoulders, hips: None, normal, Overall Severity Severity of abnormal movements (highest score from questions above): None, normal Incapacitation due to abnormal movements: None, normal Patient's awareness of abnormal movements (rate only patient's report): No Awareness, Dental Status Current problems with teeth and/or dentures?: No Does patient usually wear dentures?: No  CIWA:    COWS:     Treatment Plan Summary: Daily contact with patient to assess and evaluate symptoms and progress in treatment Medication management  Plan: Supportive approach/coping skills/relapse prevention           Continue Risperdal/Ativan            Medical Decision Making Problem Points:  Review of psycho-social stressors (1) Data Points:  Review of medication regiment & side effects (2)  I certify that inpatient services furnished can reasonably be expected to improve the patient's condition.   Kamali Nephew A 01/30/2013, 12:45 PM

## 2013-01-30 NOTE — Progress Notes (Signed)
[facility] Group Notes:  (Nursing/MHT/Case Management/Adjunct)  Date:  01/30/2013  Time:  10:20 AM  Type of Therapy:  Therapeutic Activity  Participation Level:  Did Not Attend   Dalia Heading 01/30/2013, 10:20 AM

## 2013-01-30 NOTE — Progress Notes (Signed)
Pt paranoid, guarded, and unable to clearly state her thoughts.  Pt stated, "Am I sleeping here? All those people are childish, I hear them talking.  I haven't taken pain pills in years. I hear them saying, get out of here, leave." Pt was reassured that she was safe and other pts were not speaking to her and only her roommate will be in her room.  Pt was asked what I could do to make her more comfortable, pt requested meds for sleep, anxiety, and depression.  Pt remained in her room eventually falling asleep.

## 2013-01-30 NOTE — Progress Notes (Signed)
Adult Psychoeducational Group Note  Date:  01/30/2013 Time:  0900  Group Topic/Focus:  Self inventory sheet  Participation Level:  Active  Participation Quality:  Appropriate  Affect:  Anxious, Appropriate and Flat  Cognitive:  Appropriate  Insight: Appropriate  Engagement in Group:  Engaged  Modes of Intervention:  Discussion  Additional Comments:  Participated somewhat  Roselee Culver 01/30/2013, 10:55 AM

## 2013-01-31 DIAGNOSIS — F3130 Bipolar disorder, current episode depressed, mild or moderate severity, unspecified: Secondary | ICD-10-CM

## 2013-01-31 DIAGNOSIS — F411 Generalized anxiety disorder: Secondary | ICD-10-CM

## 2013-01-31 DIAGNOSIS — F1910 Other psychoactive substance abuse, uncomplicated: Secondary | ICD-10-CM

## 2013-01-31 NOTE — Progress Notes (Addendum)
[facility] LCSW Aftercare Discharge Planning Group Note   01/31/2013 8:45 AM  Participation Quality:  Adequate  Mood/Affect:  Anxious  Depression Rating:  7-8  Anxiety Rating:  0  Thoughts of Suicide:  No Will you contract for safety?   NA  Current AVH:  No  Plan for Discharge/Comments:  Patient will return to [facility], uncertain regarding followup at this time  Transportation Means:  Uncertain  Supports: Unnamed  Dyane Dustman, Julious Payer

## 2013-01-31 NOTE — Progress Notes (Signed)
Adult Psychoeducational Group Note  Date:  01/31/2013 Time:  10:55 AM  Group Topic/Focus:  Dimensions of Wellness:   The focus of this group is to introduce the topic of wellness and discuss the role each dimension of wellness plays in total health.  Participation Level:  Active  Participation Quality:  Appropriate and Attentive  Affect:  Appropriate  Cognitive:  Alert and Appropriate  Insight: Appropriate and Good  Engagement in Group:  Engaged  Modes of Intervention:  Activity and Support  Additional Comments:  Pt is motivated to get clean.  [name] T 01/31/2013, 10:55 AM

## 2013-01-31 NOTE — [facility] Group Notes (Signed)
[facility] LCSW Group Therapy  01/31/2013 1:15 PM  Type of Therapy:  Group Therapy  Participation Level:  Active  Participation Quality:  Attentive and Sharing  Affect:  Appropriate  Cognitive:  Alert and Oriented  Insight:  Developing/Improving  Engagement in Therapy:  Developing/Improving  Modes of Intervention:  Clarification, Discussion, Socialization and Support  Summary of Progress/Problems:  Shaterrica shared her biggest obstacle to recovery will be finding a program, outpatient or inpatient that she can access.  Kinsey was attentive to all that was said during group although she verbally did not process.  Clide Dales

## 2013-01-31 NOTE — Progress Notes (Signed)
Patient ID: [name], female   DOB: 07/11/1976, 37 y.o.   MRN: [id number] [facility] MD Progress Note  01/31/2013 3:10 PM [name]  MRN:  [id number]  Subjective:  "I'm here because I overdosed on drugs. I thought my children would be better off without me. Still, it was not intentional in a way because I was trying to take the age of my pain. Been depressed since 1994. Today, I'm not depressed":  Diagnosis:  Bipolar Disorder/ Anxiety Disorder NOS/Substance Abuse  ADL's:  Intact  Sleep: did sleep better last night  Appetite:  Fair  Suicidal Ideation:  Plan:  denies Intent:  denies Means:  denies Homicidal Ideation:  Plan:  denies Intent:  denies Means:  denies  AEB (as evidenced by): Per patient  Psychiatric Specialty Exam: Review of Systems  Constitutional: Negative.   HENT: Negative.   Eyes: Negative.   Respiratory: Negative.   Cardiovascular: Negative.   Gastrointestinal: Negative.   Genitourinary: Negative.   Musculoskeletal: Negative.   Skin: Negative.   Neurological: Negative.   Endo/Heme/Allergies: Negative.   Psychiatric/Behavioral: Positive for depression and substance abuse. The patient is nervous/anxious and has insomnia.     Blood pressure 102/70, pulse 130, temperature 98.3 F (36.8 C), temperature source Oral, resp. rate 20, height 5\' 2"  (1.575 m), weight 74.39 kg (164 lb), last menstrual period 06/21/2011, SpO2 100.00%.Body mass index is 29.99 kg/(m^2).  General Appearance: Fairly Groomed  Patent attorney::  Fair  Speech:  Clear and Coherent and Slow  Volume:  Decreased  Mood:  Anxious and worried  Affect:  anxious  Thought Process:  Coherent and Goal Directed  Orientation:  Full (Time, Place, and Person)  Thought Content:  worries, not specific (no spontanous mention of not feeling safe in the unit)  Suicidal Thoughts:  No  Homicidal Thoughts:  No  Memory:  Immediate;   Fair Recent;   Fair Remote;   Fair  Judgement:  Fair  Insight:   superficial  Psychomotor Activity:  Restlessness  Concentration:  Fair  Recall:  Poor  Akathisia:  No  Handed:  Right  AIMS (if indicated):     Assets:  Desire for Improvement  Sleep:  Number of Hours: 6.25   Current Medications: Current Facility-Administered Medications  Medication Dose Route Frequency Provider Last Rate Last Dose  . acetaminophen (TYLENOL) tablet 650 mg  650 mg Oral Q6H PRN Cleotis Nipper, MD      . albuterol (PROVENTIL HFA;VENTOLIN HFA) 108 (90 BASE) MCG/ACT inhaler 2 puff  2 puff Inhalation Q6H PRN Cleotis Nipper, MD   2 puff at 01/28/13 2151  . alum & mag hydroxide-simeth (MAALOX/MYLANTA) 200-200-20 MG/5ML suspension 30 mL  30 mL Oral Q4H PRN Cleotis Nipper, MD      . amoxicillin-clavulanate (AUGMENTIN) 875-125 MG per tablet 1 tablet  1 tablet Oral BID Cleotis Nipper, MD   1 tablet at 01/31/13 0808  . citalopram (CELEXA) tablet 20 mg  20 mg Oral Daily Cleotis Nipper, MD   20 mg at 01/31/13 7829  . LORazepam (ATIVAN) tablet 1 mg  1 mg Oral Q8H PRN Rachael Fee, MD      . magnesium hydroxide (MILK OF MAGNESIA) suspension 30 mL  30 mL Oral Daily PRN Cleotis Nipper, MD      . pantoprazole (PROTONIX) EC tablet 40 mg  40 mg Oral QHS Cleotis Nipper, MD   40 mg at 01/30/13 2147  . potassium chloride SA (K-DUR,KLOR-CON) CR  tablet 20 mEq  20 mEq Oral Daily Cleotis Nipper, MD   20 mEq at 01/31/13 0808  . risperiDONE (RISPERDAL) tablet 0.5 mg  0.5 mg Oral TID PRN Rachael Fee, MD   0.5 mg at 01/30/13 2148  . risperiDONE (RISPERDAL) tablet 1 mg  1 mg Oral BID Rachael Fee, MD   1 mg at 01/31/13 0809  . traMADol (ULTRAM) tablet 50 mg  50 mg Oral BID PRN Cleotis Nipper, MD   50 mg at 01/29/13 2235  . traZODone (DESYREL) tablet 100 mg  100 mg Oral QHS PRN Rachael Fee, MD   100 mg at 01/30/13 2148    Lab Results: No results found for this or any previous visit (from the past 48 hour(s)).  Physical Findings: AIMS: Facial and Oral Movements Muscles of Facial Expression: None,  normal Lips and Perioral Area: None, normal Jaw: None, normal Tongue: None, normal,Extremity Movements Upper (arms, wrists, hands, fingers): None, normal Lower (legs, knees, ankles, toes): None, normal, Trunk Movements Neck, shoulders, hips: None, normal, Overall Severity Severity of abnormal movements (highest score from questions above): None, normal Incapacitation due to abnormal movements: None, normal Patient's awareness of abnormal movements (rate only patient's report): No Awareness, Dental Status Current problems with teeth and/or dentures?: No Does patient usually wear dentures?: No  CIWA:    COWS:     Treatment Plan Summary: Daily contact with patient to assess and evaluate symptoms and progress in treatment Medication management  Plan: Supportive approach/coping skills/relapse prevention No new changes made on the current treatment plans. Continue current plan of care.           Medical Decision Making Problem Points:  Review of psycho-social stressors (1) Data Points:  Review of medication regiment & side effects (2)  I certify that inpatient services furnished can reasonably be expected to improve the patient's condition.   Armandina Stammer I 01/31/2013, 3:10 PM

## 2013-01-31 NOTE — Progress Notes (Signed)
Patient ID: [name], female   DOB: Dec 28, 1975, 37 y.o.   MRN: [id number] D)  Was sad and quiet this evening, but came out of her room and has begun to interact a little more with peers.  Stated she had gotten a bad phone call from her mother, but stated they're almost always bad, and stated no one knows what she deals with.  When asked about it, she made a face, rolled her eyes and didn't seem to want to talk about it.  States voices are less, and denies thoughts of self harm. A)  Will continue to monitor for safety, continue POC, and to try to develop therapeutic communication. R)  Guarded, remains safe on unit.

## 2013-01-31 NOTE — Progress Notes (Signed)
Patient did attend the evening speaker AA meeting.  

## 2013-02-01 MED ORDER — NICOTINE 21 MG/24HR TD PT24
21.0000 mg | MEDICATED_PATCH | Freq: Every day | TRANSDERMAL | Status: DC
  Administered 2013-02-01 – 2013-02-03 (×2): 21 mg via TRANSDERMAL
  Filled 2013-02-01 (×5): qty 1

## 2013-02-01 NOTE — Progress Notes (Signed)
[facility] LCSW Group Therapy  02/01/2013 1:15 PM  Type of Therapy:  Group Therapy 1:15 to 2:30PM  Participation Level:  Active  Participation Quality:  Appropriate and Attentive  Affect:  Appropriate  Cognitive:  Appropriate  Engagement in Therapy:  Engaged  Modes of Intervention:  Discussion, Education and Support  Summary of Progress/Problems: Patient attended group presentation by staff member of  [employer] ([employer]). Nahjae was attentive and appropriate during session.   Clide Dales

## 2013-02-01 NOTE — Progress Notes (Signed)
[facility] MD Progress Note  02/01/2013 5:34 PM [name]  MRN:  [id number] Subjective:  [name] states that she is still feeling somewhat anxious, and her mood is still depressed. She is able to deal with her paranoid ideas being challenged. Her reality testing is improving. She wants to go to a rehab unit. More up front about her substance abuse. Diagnosis:  Bipolar Disorder, Substance Abuse, Anxiety Disorder  ADL's:  Intact  Sleep: Fair  Appetite:  Fair  Suicidal Ideation:  Plan:  denies Intent:  denies Means:  denies Homicidal Ideation:  Plan:  denies Intent:  denies Means:  denies AEB (as evidenced by):  Psychiatric Specialty Exam: Review of Systems  Constitutional: Negative.   HENT: Negative.   Eyes: Negative.   Respiratory: Negative.   Cardiovascular: Negative.   Gastrointestinal: Negative.   Genitourinary: Negative.   Musculoskeletal: Negative.   Skin: Negative.   Neurological: Negative.   Endo/Heme/Allergies: Negative.   Psychiatric/Behavioral: Positive for substance abuse. The patient is nervous/anxious and has insomnia.     Blood pressure 123/70, pulse 96, temperature 99.8 F (37.7 C), temperature source Oral, resp. rate 18, height 5\' 2"  (1.575 m), weight 74.39 kg (164 lb), last menstrual period 06/21/2011, SpO2 100.00%.Body mass index is 29.99 kg/(m^2).  General Appearance: Fairly Groomed  Patent attorney::  Fair  Speech:  Clear and Coherent and not spontaneous  Volume:  Decreased  Mood:  Anxious and worried  Affect:  anxious  Thought Process:  Coherent and Goal Directed  Orientation:  Full (Time, Place, and Person)  Thought Content:  worries, concerns, underlying suspiciousness  Suicidal Thoughts:  No  Homicidal Thoughts:  No  Memory:  Immediate;   Fair Recent;   Fair Remote;   Fair  Judgement:  Fair  Insight:  Shallow  Psychomotor Activity:  Restlessness  Concentration:  Fair  Recall:  Fair  Akathisia:  No  Handed:  Right  AIMS (if indicated):      Assets:  Desire for Improvement  Sleep:  Number of Hours: 6.75   Current Medications: Current Facility-Administered Medications  Medication Dose Route Frequency Provider Last Rate Last Dose  . acetaminophen (TYLENOL) tablet 650 mg  650 mg Oral Q6H PRN Cleotis Nipper, MD      . albuterol (PROVENTIL HFA;VENTOLIN HFA) 108 (90 BASE) MCG/ACT inhaler 2 puff  2 puff Inhalation Q6H PRN Cleotis Nipper, MD   2 puff at 01/28/13 2151  . alum & mag hydroxide-simeth (MAALOX/MYLANTA) 200-200-20 MG/5ML suspension 30 mL  30 mL Oral Q4H PRN Cleotis Nipper, MD      . amoxicillin-clavulanate (AUGMENTIN) 875-125 MG per tablet 1 tablet  1 tablet Oral BID Cleotis Nipper, MD   1 tablet at 02/01/13 1723  . citalopram (CELEXA) tablet 20 mg  20 mg Oral Daily Cleotis Nipper, MD   20 mg at 02/01/13 0754  . LORazepam (ATIVAN) tablet 1 mg  1 mg Oral Q8H PRN Rachael Fee, MD   1 mg at 01/31/13 2151  . magnesium hydroxide (MILK OF MAGNESIA) suspension 30 mL  30 mL Oral Daily PRN Cleotis Nipper, MD      . nicotine (NICODERM CQ - dosed in mg/24 hours) patch 21 mg  21 mg Transdermal Q0600 Sanjuana Kava, NP   21 mg at 02/01/13 1314  . pantoprazole (PROTONIX) EC tablet 40 mg  40 mg Oral QHS Cleotis Nipper, MD   40 mg at 01/31/13 2151  . potassium chloride SA (K-DUR,KLOR-CON) CR tablet  20 mEq  20 mEq Oral Daily Cleotis Nipper, MD   20 mEq at 02/01/13 0754  . risperiDONE (RISPERDAL) tablet 0.5 mg  0.5 mg Oral TID PRN Rachael Fee, MD   0.5 mg at 01/30/13 2148  . risperiDONE (RISPERDAL) tablet 1 mg  1 mg Oral BID Rachael Fee, MD   1 mg at 02/01/13 1723  . traMADol (ULTRAM) tablet 50 mg  50 mg Oral BID PRN Cleotis Nipper, MD   50 mg at 02/01/13 1725  . traZODone (DESYREL) tablet 100 mg  100 mg Oral QHS PRN Rachael Fee, MD   100 mg at 01/31/13 2151    Lab Results: No results found for this or any previous visit (from the past 48 hour(s)).  Physical Findings: AIMS: Facial and Oral Movements Muscles of Facial Expression: None,  normal Lips and Perioral Area: None, normal Jaw: None, normal Tongue: None, normal,Extremity Movements Upper (arms, wrists, hands, fingers): None, normal Lower (legs, knees, ankles, toes): None, normal, Trunk Movements Neck, shoulders, hips: None, normal, Overall Severity Severity of abnormal movements (highest score from questions above): None, normal Incapacitation due to abnormal movements: None, normal Patient's awareness of abnormal movements (rate only patient's report): No Awareness, Dental Status Current problems with teeth and/or dentures?: No Does patient usually wear dentures?: No  CIWA:    COWS:     Treatment Plan Summary: Daily contact with patient to assess and evaluate symptoms and progress in treatment Medication management  Plan: Supportive approach/coping skills/relapse prevention           Continue to address the symptoms/continue to improve reality testing  Medical Decision Making Problem Points:  Review of psycho-social stressors (1) Data Points:  Review of medication regiment & side effects (2)  I certify that inpatient services furnished can reasonably be expected to improve the patient's condition.   [name] A 02/01/2013, 5:34 PM

## 2013-02-01 NOTE — Progress Notes (Signed)
Recreation Therapy Notes  Date: 04.15.2014 Time: 3:00pm Location: [address] Day Room      Group Topic/Focus: Decision Making & Goal Setting  Participation Level: Active  Participation Quality: Appropriate  Affect: Euthymic  Cognitive: Appropriate   Additional Comments: Activity: Choices in a Jar & [employer]; Explanation: Choices in a Jar - Patients were asked a series of either or questions from Choices in a Jar. Patients were asked to choice one option out of the two provided. [place] - A beach ball was placed between patient and peer or LRT hips. Patients were asked to set a goal of how far they could walk without the ball falling to the floor. Patients were instructed not to touch the ball or each other to complete this task.   Patient actively participated in Choices in a Jar. Patient participated in group discussion about making good choices. Patient actively participated in [employer]. Patient with peer set goal of making it to the end of 300 hallway. Patient and peer successful at reaching this goal. When patient and peer reached their first goal the set a goal of making back to the starting place. Patient and peer were unsuccessful at reaching this goal. Patient participated in discussion about goal setting effecting sobriety.   Marykay Lex Debara Kamphuis, LRT/CTRS   Kima Malenfant L 02/01/2013 3:50 PM

## 2013-02-01 NOTE — Progress Notes (Signed)
Pt has been up and active in the milieu today.  She did report having poor sleep but after she took her sleep meds she was able to sleep her check sheet showed 6.75 hours.  Pt now rates her depression 2 hopelessness 1 and her anxiety a 2.  She feels like her medications are starting to work for her now.  She was moved today to another room for her room mate was keeping the room "too cold" and felt "pushed".  Pt is trying to get into [facility] and will f/u at [facility] out-pt after for her care.

## 2013-02-01 NOTE — Progress Notes (Signed)
[facility] LCSW Aftercare Discharge Planning Group Note   02/01/2013 8:45 AM   Participation Quality:  Adequate   Mood/Affect:  Appropriate  Depression Rating:  1  Anxiety Rating:  1  Thoughts of Suicide:  No Will you contract for safety?   NA  Current AVH:  No  Plan for Discharge/Comments:  Referring patient to [facility] and [facility] in [facility]   Transportation Means: Friend  Supports: one friend and mother, very little support as per patient  [name]

## 2013-02-01 NOTE — Progress Notes (Signed)
Pt observed in the game room watching other patients play cards.  Pt standing off to the side.  Pt presents with flat/sad affect.  Conversation with patient is minimal.  Pt voices not complaints except that she has not been able to sleep.  Offered pt Ativan to decrease her anxiety which she accepted.  Pt still having passive SI, but contracts for safety.  Denies HI/AV.  Pt says she is unsure when she will be discharged and is anxious about where she will go.  She wants a 28 day program which is still undecided.  Support/encouragement offered.  Safety maintained with q15 minute checks.

## 2013-02-02 LAB — COMPREHENSIVE METABOLIC PANEL
ALT: 20 U/L (ref 0–35)
Alkaline Phosphatase: 117 U/L (ref 39–117)
CO2: 27 mEq/L (ref 19–32)
GFR calc Af Amer: >90 mL/min (ref >90)
Glucose, Bld: 88 mg/dL (ref 70–99)
Potassium: 4.5 mEq/L (ref 3.5–5.1)
Sodium: 137 mEq/L (ref 135–145)
Total Protein: 7.9 g/dL (ref 6.0–8.3)

## 2013-02-02 MED ORDER — CITALOPRAM HYDROBROMIDE 20 MG PO TABS: 20.0000 mg | ORAL_TABLET | Freq: Every day | ORAL | Status: AC

## 2013-02-02 MED ORDER — RISPERIDONE 1 MG PO TABS: 1.0000 mg | ORAL_TABLET | Freq: Two times a day (BID) | ORAL | Status: DC

## 2013-02-02 MED ORDER — AMOXICILLIN-POT CLAVULANATE 875-125 MG PO TABS: ORAL_TABLET | ORAL | Status: DC

## 2013-02-02 MED ORDER — POTASSIUM CHLORIDE CRYS ER 20 MEQ PO TBCR: 20.0000 meq | EXTENDED_RELEASE_TABLET | Freq: Every day | ORAL | Status: DC

## 2013-02-02 MED ORDER — ALBUTEROL SULFATE HFA 108 (90 BASE) MCG/ACT IN AERS: 2.0000 | INHALATION_SPRAY | Freq: Four times a day (QID) | RESPIRATORY_TRACT | Status: DC

## 2013-02-02 MED ORDER — TRAZODONE HCL 100 MG PO TABS: 100.0000 mg | ORAL_TABLET | Freq: Every evening | ORAL | Status: DC | PRN

## 2013-02-02 NOTE — Tx Team (Signed)
Interdisciplinary Treatment Plan Update (Adult)  Date: 02/02/2013  Time Reviewed: 10:20 AM   Progress in Treatment: Attending groups: Yes Participating in groups: Yes Taking medication as prescribed:  Yes Tolerating medication:  Yes Family/Significant othe contact made: No Patient understands diagnosis: Yes Discussing patient identified problems/goals with staff: Yes Medical problems stabilized or resolved:  Yes Denies suicidal/homicidal ideation: Yes Patient has not harmed self or Others: Yes  New problem(s) identified: None Identified  Discharge Plan or Barriers:  CSW is assessing for appropriate referrals.   Additional comments: N/A  Reason for Continuation of Hospitalization: NA   Estimated length of stay: Discharge today to [facility]  For review of initial/current patient goals, please see plan of care.  Attendees: Patient:     Family:     Physician:  Geoffery Lyons 02/02/2013 10:20 AM   Nursing:   Roswell Miners, RN 02/02/2013 10:20 AM   Clinical Social Worker Ronda Fairly 02/02/2013 10:20 AM   Other:  Robbie Louis, RN 02/02/2013 10:20 AM   Other: Serena Colonel, NP 02/02/2013 10:20 AM  Other:  Other: Alla German PA Student  02/02/2013 10:20 AM      Other:  Other: Edger House, MA [employer] Psych Intern  02/02/2013 10:20 AM    Scribe for Treatment Team:   Carney Bern, LCSWA  02/02/2013 10:20 AM

## 2013-02-02 NOTE — [facility] Suicide Risk Assessment (Signed)
Suicide Risk Assessment  Discharge Assessment     Demographic Factors:  Caucasian  Mental Status Per Nursing Assessment::   On Admission:  NA  Current Mental Status by Physician: In full contact with reality. There are no suicidal ideas, plans or intent. Her mood is "better," her affect is appropriate. She is willing and motivated to pursue further work on her recovery. She is going  to [facility] today.    Loss Factors: Loss of significant relationship  Historical Factors: NA  Risk Reduction Factors:   Sense of responsibility to family and Positive social support  Continued Clinical Symptoms:  Bipolar Disorder:   Depressive phase Alcohol/Substance Abuse/Dependencies  Cognitive Features That Contribute To Risk:  Closed-mindedness Thought constriction (tunnel vision)    Suicide Risk:  Minimal: No identifiable suicidal ideation.  Patients presenting with no risk factors but with morbid ruminations; may be classified as minimal risk based on the severity of the depressive symptoms  Discharge Diagnoses:   AXIS I:  Anxiety Disorder NOS, Bipolar, Depressed and Substance Abuse AXIS II:  Deferred AXIS III:   Past Medical History  Diagnosis Date  . Depression   . Cyst of kidney, acquired    AXIS IV:  housing problems, other psychosocial or environmental problems and problems with primary support group AXIS V:  61-70 mild symptoms  Plan Of Care/Follow-up recommendations:  Activity:  as tolerated Diet:  regular Will be admitted to [facility] today Is patient on multiple antipsychotic therapies at discharge:  No   Has Patient had three or more failed trials of antipsychotic monotherapy by history:  No  Recommended Plan for Multiple Antipsychotic Therapies: N/A   Enrigue Hashimi A 02/03/2013, 1:59 PM

## 2013-02-02 NOTE — Progress Notes (Signed)
[facility] MD Progress Note  02/02/2013 4:01 PM [name]  MRN:  [id number] Subjective:  [name] will be admitted to [facility] in the morning. She is relieved that she is going to be able to get more treatment. She is still endorsing anxiety, worry. States that she wants to comply with the medications she is taking. Has found them to be useful Diagnosis:  Bipolar Disorder, Substance Abuse  ADL's:  Intact  Sleep: Fair  Appetite:  Fair  Suicidal Ideation:  Plan:  Denies Intent:  denies Means:  denies Homicidal Ideation:  Plan:  denies Intent:  denies Means:  denies AEB (as evidenced by):  Psychiatric Specialty Exam: Review of Systems  Constitutional: Negative.   HENT: Negative.   Eyes: Negative.   Respiratory: Negative.   Cardiovascular: Negative.   Gastrointestinal: Negative.   Genitourinary: Negative.   Musculoskeletal: Negative.   Skin: Negative.   Neurological: Negative.   Endo/Heme/Allergies: Negative.   Psychiatric/Behavioral: Positive for substance abuse. The patient is nervous/anxious.     Blood pressure 123/70, pulse 96, temperature 99.8 F (37.7 C), temperature source Oral, resp. rate 18, height 5\' 2"  (1.575 m), weight 74.39 kg (164 lb), last menstrual period 06/21/2011, SpO2 100.00%.Body mass index is 29.99 kg/(m^2).  General Appearance: Fairly Groomed  Patent attorney::  Fair  Speech:  Clear and Coherent  Volume:  Decreased  Mood:  worried  Affect:  worried  Thought Process:  Coherent and Goal Directed  Orientation:  Full (Time, Place, and Person)  Thought Content:  WDL  Suicidal Thoughts:  No  Homicidal Thoughts:  No  Memory:  Immediate;   Fair Recent;   Fair Remote;   Fair  Judgement:  Fair  Insight:  Present  Psychomotor Activity:  Normal  Concentration:  Fair  Recall:  Fair  Akathisia:  No  Handed:  Right  AIMS (if indicated):     Assets:  Desire for Improvement  Sleep:  Number of Hours: 6.25   Current Medications: Current Facility-Administered  Medications  Medication Dose Route Frequency Provider Last Rate Last Dose  . acetaminophen (TYLENOL) tablet 650 mg  650 mg Oral Q6H PRN Cleotis Nipper, MD      . albuterol (PROVENTIL HFA;VENTOLIN HFA) 108 (90 BASE) MCG/ACT inhaler 2 puff  2 puff Inhalation Q6H PRN Cleotis Nipper, MD   2 puff at 02/01/13 2220  . alum & mag hydroxide-simeth (MAALOX/MYLANTA) 200-200-20 MG/5ML suspension 30 mL  30 mL Oral Q4H PRN Cleotis Nipper, MD      . amoxicillin-clavulanate (AUGMENTIN) 875-125 MG per tablet 1 tablet  1 tablet Oral BID Cleotis Nipper, MD   1 tablet at 02/02/13 0803  . citalopram (CELEXA) tablet 20 mg  20 mg Oral Daily Cleotis Nipper, MD   20 mg at 02/02/13 0804  . LORazepam (ATIVAN) tablet 1 mg  1 mg Oral Q8H PRN Rachael Fee, MD   1 mg at 02/01/13 2222  . magnesium hydroxide (MILK OF MAGNESIA) suspension 30 mL  30 mL Oral Daily PRN Cleotis Nipper, MD      . nicotine (NICODERM CQ - dosed in mg/24 hours) patch 21 mg  21 mg Transdermal Q0600 Sanjuana Kava, NP   21 mg at 02/01/13 1314  . pantoprazole (PROTONIX) EC tablet 40 mg  40 mg Oral QHS Cleotis Nipper, MD   40 mg at 02/01/13 2219  . potassium chloride SA (K-DUR,KLOR-CON) CR tablet 20 mEq  20 mEq Oral Daily Cleotis Nipper, MD  20 mEq at 02/02/13 0803  . risperiDONE (RISPERDAL) tablet 0.5 mg  0.5 mg Oral TID PRN Rachael Fee, MD   0.5 mg at 01/30/13 2148  . risperiDONE (RISPERDAL) tablet 1 mg  1 mg Oral BID Rachael Fee, MD   1 mg at 02/02/13 4132  . traMADol (ULTRAM) tablet 50 mg  50 mg Oral BID PRN Cleotis Nipper, MD   50 mg at 02/01/13 1725  . traZODone (DESYREL) tablet 100 mg  100 mg Oral QHS PRN Rachael Fee, MD   100 mg at 02/01/13 2222    Lab Results:  Results for orders placed during the hospital encounter of 01/27/13 (from the past 48 hour(s))  COMPREHENSIVE METABOLIC PANEL     Status: None   Collection Time    02/02/13 12:15 PM      Result Value Range   Sodium 137  135 - 145 mEq/L   Potassium 4.5  3.5 - 5.1 mEq/L   Chloride 100  96 -  112 mEq/L   CO2 27  19 - 32 mEq/L   Glucose, Bld 88  70 - 99 mg/dL   BUN 13  6 - 23 mg/dL   Creatinine, Ser 4.40  0.50 - 1.10 mg/dL   Calcium 9.9  8.4 - 10.2 mg/dL   Total Protein 7.9  6.0 - 8.3 g/dL   Albumin 3.8  3.5 - 5.2 g/dL   AST 17  0 - 37 U/L   ALT 20  0 - 35 U/L   Alkaline Phosphatase 117  39 - 117 U/L   Total Bilirubin 0.3  0.3 - 1.2 mg/dL   GFR calc non Af Amer >90  >90 mL/min   GFR calc Af Amer >90  >90 mL/min   Comment:            The eGFR has been calculated     using the CKD EPI equation.     This calculation has not been     validated in all clinical     situations.     eGFR's persistently     <90 mL/min signify     possible Chronic Kidney Disease.    Physical Findings: AIMS: Facial and Oral Movements Muscles of Facial Expression: None, normal Lips and Perioral Area: None, normal Jaw: None, normal Tongue: None, normal,Extremity Movements Upper (arms, wrists, hands, fingers): None, normal Lower (legs, knees, ankles, toes): None, normal, Trunk Movements Neck, shoulders, hips: None, normal, Overall Severity Severity of abnormal movements (highest score from questions above): None, normal Incapacitation due to abnormal movements: None, normal Patient's awareness of abnormal movements (rate only patient's report): No Awareness, Dental Status Current problems with teeth and/or dentures?: No Does patient usually wear dentures?: No  CIWA:    COWS:     Treatment Plan Summary: Daily contact with patient to assess and evaluate symptoms and progress in treatment Medication management  Plan: Supportive approach/coping skills/relapse prevention           Optimize treatment with the psychotropic agents  Medical Decision Making Problem Points:  Review of last therapy session (1) and Review of psycho-social stressors (1) Data Points:  Review of medication regiment & side effects (2)  I certify that inpatient services furnished can reasonably be expected to improve  the patient's condition.   [name] A 02/02/2013, 4:01 PM

## 2013-02-02 NOTE — Progress Notes (Signed)
[facility] LCSW Aftercare Discharge Planning Group Note   02/02/2013 8:45 AM   Participation Quality:  Appropriate  Mood/Affect: Appropriate  Depression Rating:  1  Anxiety Rating:  2  Thoughts of Suicide: No  Will you contract for safety?   NA  Current AVH: None  Plan for Discharge/Comments:  Patient has been reffered to [facility], [facility] is back up plan  Transportation Means:  [facility] hopefully  Supports: Mom  Dyane Dustman, Julious Payer
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
## 2013-02-02 NOTE — Progress Notes (Signed)
Adult Psychoeducational Group Note  Date:  02/02/2013 Time:  10:41 PM  Group Topic/Focus:  NA  Participation Level:  Active  Participation Quality:  Appropriate  Affect:  Appropriate  Cognitive:  Alert  Insight: Appropriate  Engagement in Group:  Engaged  Modes of Intervention:  Discussion  Additional Comments:    Flonnie Hailstone 02/02/2013, 10:41 PM

## 2013-02-02 NOTE — Progress Notes (Signed)
[facility] LCSW Group Therapy  02/02/2013 1:15 PM  Type of Therapy: Group Therapy 1:15 to 2:30 PM  Participation Level: Adequate  Participation Quality:  Attentive; sharing only when called upon directly  Affect:  Appropriate  Cognitive:  Alert and oriented  Insight: Developing  Engagement in Therapy: Developing    Modes of Intervention:  Discussion, Clarification, Education, and Support  Summary of Progress/Problems: Warmup portion of group allowed patient's to choose from multiple emotions as to how they are feeling today. Emotions listed ranged from anger, denial, loneliness, hopeful, guilty, shamed, happiness, guilt and hopelessness. Patients were then asked if those same emotions might be ones they often have difficulty regulating. [name] was able to process how her emotions often lead her to using substances when asked direct questions.   [name]

## 2013-02-02 NOTE — Progress Notes (Signed)
Patient ID: [name], female   DOB: May 20, 1976, 37 y.o.   MRN: [id number] She has been up and about interacting with peers and staff. She denies thoughts of harming self and has not requested any prn medications. Self inventory:  Depression and hopelessness at 1, denies SI.

## 2013-02-02 NOTE — [facility] Suicide Risk Assessment (Signed)
[facility] INPATIENT:  Family/Significant Other Suicide Prevention Education  Suicide Prevention Education:  Patient Discharged to Other Healthcare Facility:  Suicide Prevention Education Not Provided: {PT. DISCHARGED TO OTHER HEALTHCARE FACILITY:SUICIDE PREVENTION EDUCATION NOT PROVIDED (CHL):  The patient is discharging to another healthcare facility for continuation of treatment.  The patient's medical information, including suicide ideations and risk factors, are a part of the medical information shared with the receiving healthcare facility.  Clide Dales 02/02/2013, 5:22 PM

## 2013-02-02 NOTE — Progress Notes (Signed)
Patient ID: [name], female   DOB: Aug 29, 1976, 37 y.o.   MRN: [id number]  D: Pt denies SI/HI/AVH. Pt is pleasant and cooperative. Pt states she had pain in her back and leg earlier today, but tonight it feels better. Pt in good spirits today.  A: Pt was offered support and encouragement. Pt was given scheduled medications. Pt was encourage to attend groups. Q 15 minute checks were done for safety. Pt given Albuterol at 2220, Trazodone and ativan at 2222 to help her relax and sleep.   R:Pt attends groups and interacts well with peers and staff. Pt is taking medication. Pt has no complaints.Pt receptive to treatment and safety maintained on unit.

## 2013-02-03 NOTE — Progress Notes (Signed)
[facility] LCSW Aftercare Discharge Planning Group Note   02/03/2013 8:45 AM  Participation Quality:  Appropriate  Mood/Affect:  Appropriate  Depression Rating:  1  Anxiety Rating:  3-4, some anxiety about move to [facility]  Thoughts of Suicide:  No Will you contract for safety?  NA  Current AVH:  No  Plan for Discharge/Comments:  Transfer to [employer]: [facility]  Supports: Mother  Clide Dales

## 2013-02-03 NOTE — Progress Notes (Signed)
Patient ID: [name], female   DOB: [birth year], 37 y.o.   MRN: [id number] She has been discharged aand was picked up by  [facility]  Staff. She voiced understanding of discharge instruction and of follow up plan. She denies thoughts of harming self and all belongings were taken home with her.

## 2013-02-03 NOTE — Progress Notes (Signed)
Patient ID: [name], female   DOB: [birth year], 37 y.o.   MRN: [id number] Patient has been accepted at [facility], now waiting on Authorization from [employer] for her stay there. Carney Bern, LCSWA

## 2013-02-03 NOTE — Progress Notes (Signed)
Patient ID: [name], female   DOB: 1976-08-07, 37 y.o.   MRN: [id number] D: Took over patient's care @ 2330. Patient in bed sleeping. Respiration regular and unlabored. No sign of distress noted at this time A: 15 mins checks for safety. R: Patient remains asleep. Pt is safe.

## 2013-02-08 NOTE — Progress Notes (Signed)
Patient Discharge Instructions:  After Visit Summary (AVS):   Faxed to:  02/08/13 Discharge Summary Note:   Faxed to:  02/08/13 Psychiatric Admission Assessment Note:   Faxed to:  02/08/13 Suicide Risk Assessment - Discharge Assessment:   Faxed to:  02/08/13 Faxed/Sent to the Next Level Care provider:  02/08/13 Faxed to [facility] @ [phone]  Jerelene Redden, 02/08/2013, 4:31 PM

## 2013-04-17 ENCOUNTER — Emergency Department (HOSPITAL_COMMUNITY): Payer: Medicaid Other

## 2013-04-17 ENCOUNTER — Encounter (HOSPITAL_COMMUNITY): Payer: Self-pay | Admitting: *Deleted

## 2013-04-17 ENCOUNTER — Emergency Department (HOSPITAL_COMMUNITY)
Admission: EM | Admit: 2013-04-17 | Discharge: 2013-04-17 | Disposition: A | Discharge status: Home / Self Care | Payer: Medicaid Other | Attending: Emergency Medicine | Admitting: Emergency Medicine

## 2013-04-17 DIAGNOSIS — S0993XA Unspecified injury of face, initial encounter: Secondary | ICD-10-CM | POA: Insufficient documentation

## 2013-04-17 DIAGNOSIS — F1010 Alcohol abuse, uncomplicated: Secondary | ICD-10-CM | POA: Insufficient documentation

## 2013-04-17 DIAGNOSIS — S199XXA Unspecified injury of neck, initial encounter: Secondary | ICD-10-CM | POA: Insufficient documentation

## 2013-04-17 DIAGNOSIS — F17200 Nicotine dependence, unspecified, uncomplicated: Secondary | ICD-10-CM | POA: Insufficient documentation

## 2013-04-17 DIAGNOSIS — F329 Major depressive disorder, single episode, unspecified: Secondary | ICD-10-CM | POA: Insufficient documentation

## 2013-04-17 DIAGNOSIS — F3289 Other specified depressive episodes: Secondary | ICD-10-CM | POA: Insufficient documentation

## 2013-04-17 DIAGNOSIS — Z79899 Other long term (current) drug therapy: Secondary | ICD-10-CM | POA: Insufficient documentation

## 2013-04-17 DIAGNOSIS — S0590XA Unspecified injury of unspecified eye and orbit, initial encounter: Secondary | ICD-10-CM | POA: Insufficient documentation

## 2013-04-17 DIAGNOSIS — R040 Epistaxis: Secondary | ICD-10-CM | POA: Insufficient documentation

## 2013-04-17 DIAGNOSIS — Z87448 Personal history of other diseases of urinary system: Secondary | ICD-10-CM | POA: Insufficient documentation

## 2013-04-17 MED ORDER — HYDROCODONE-ACETAMINOPHEN 5-325 MG PO TABS
1.0000 | ORAL_TABLET | Freq: Once | ORAL | Status: AC
  Administered 2013-04-17: 1 via ORAL
  Filled 2013-04-17: qty 1

## 2013-04-17 MED ORDER — ALBUTEROL SULFATE (5 MG/ML) 0.5% IN NEBU
2.5000 mg | INHALATION_SOLUTION | Freq: Once | RESPIRATORY_TRACT | Status: AC
  Administered 2013-04-17: 2.5 mg via RESPIRATORY_TRACT
  Filled 2013-04-17: qty 0.5
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
## 2013-04-17 NOTE — ED Notes (Signed)
Pt alert & oriented x4, stable gait. Family member given discharge instructions, paperwork & prescription(s). Patient left department yelling about how poor of care she received.

## 2013-04-17 NOTE — ED Notes (Signed)
Pt upset when she did not get additional pain medication. Pt stated she was going to return later today since she was in so much pain & we only gave her a [place] vicodin. Pt refused wheelchair offered to carry her out to waiting room.

## 2013-04-17 NOTE — ED Notes (Signed)
As soon as pt got pain medication she asked if she could have a shot for pain also. Pt informed she was just medicated for pain.

## 2013-04-17 NOTE — ED Notes (Signed)
Pt requesting a shot of pain medication, EDP notified.
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
## 2013-06-28 ENCOUNTER — Encounter (HOSPITAL_COMMUNITY): Payer: Self-pay | Admitting: *Deleted

## 2013-06-28 ENCOUNTER — Emergency Department (HOSPITAL_COMMUNITY)
Admission: EM | Admit: 2013-06-28 | Discharge: 2013-06-28 | Disposition: A | Discharge status: Home / Self Care | Payer: Medicaid Other | Attending: Emergency Medicine | Admitting: Emergency Medicine

## 2013-06-28 DIAGNOSIS — Z87448 Personal history of other diseases of urinary system: Secondary | ICD-10-CM | POA: Insufficient documentation

## 2013-06-28 DIAGNOSIS — F17200 Nicotine dependence, unspecified, uncomplicated: Secondary | ICD-10-CM | POA: Insufficient documentation

## 2013-06-28 DIAGNOSIS — Z79899 Other long term (current) drug therapy: Secondary | ICD-10-CM | POA: Insufficient documentation

## 2013-06-28 DIAGNOSIS — K0510 Chronic gingivitis, plaque induced: Secondary | ICD-10-CM | POA: Insufficient documentation

## 2013-06-28 DIAGNOSIS — K029 Dental caries, unspecified: Secondary | ICD-10-CM | POA: Insufficient documentation

## 2013-06-28 DIAGNOSIS — F3289 Other specified depressive episodes: Secondary | ICD-10-CM | POA: Insufficient documentation

## 2013-06-28 DIAGNOSIS — F329 Major depressive disorder, single episode, unspecified: Secondary | ICD-10-CM | POA: Insufficient documentation

## 2013-06-28 HISTORY — DX: Poisoning by unspecified drugs, medicaments and biological substances, accidental (unintentional), initial encounter: T50.901A

## 2013-06-28 HISTORY — DX: Intentional self-harm by other specified means, initial encounter: X83.8XXA

## 2013-06-28 MED ORDER — MAGIC MOUTHWASH W/LIDOCAINE: 5.0000 mL | Freq: Three times a day (TID) | ORAL | Status: DC

## 2013-06-28 MED ORDER — CLINDAMYCIN HCL 150 MG PO CAPS: 300.0000 mg | ORAL_CAPSULE | Freq: Four times a day (QID) | ORAL | Status: DC
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
## 2013-06-28 NOTE — ED Notes (Signed)
Pt was seen by me at time of d/c only.

## 2013-06-28 NOTE — ED Notes (Signed)
Pt states that she had two tooth pulled a week ago but the area has become painful, unable to see dentist again for "awhile"

## 2013-07-01 NOTE — ED Provider Notes (Signed)
Medical screening examination/treatment/procedure(s) were performed by non-physician practitioner and as supervising physician I was immediately available for consultation/collaboration.    Jeremaih Klima D Vivyan Biggers, MD 07/01/13 0911 

## 2014-04-19 ENCOUNTER — Other Ambulatory Visit: Payer: Self-pay | Admitting: Neurology

## 2014-04-19 ENCOUNTER — Ambulatory Visit (HOSPITAL_COMMUNITY)
Admission: RE | Admit: 2014-04-19 | Discharge: 2014-04-19 | Disposition: A | Discharge status: Home / Self Care | Payer: Medicaid Other | Source: Ambulatory Visit | Attending: Neurology | Admitting: Neurology

## 2014-04-19 DIAGNOSIS — M7989 Other specified soft tissue disorders: Secondary | ICD-10-CM | POA: Diagnosis present

## 2014-04-19 DIAGNOSIS — R600 Localized edema: Secondary | ICD-10-CM
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
## 2015-06-28 ENCOUNTER — Emergency Department (HOSPITAL_COMMUNITY): Payer: Medicaid Other

## 2015-06-28 ENCOUNTER — Emergency Department (HOSPITAL_COMMUNITY)
Admission: EM | Admit: 2015-06-28 | Discharge: 2015-06-28 | Disposition: A | Discharge status: Home / Self Care | Payer: Medicaid Other | Attending: Emergency Medicine | Admitting: Emergency Medicine

## 2015-06-28 ENCOUNTER — Encounter (HOSPITAL_COMMUNITY): Payer: Self-pay | Admitting: Emergency Medicine

## 2015-06-28 DIAGNOSIS — W01198A Fall on same level from slipping, tripping and stumbling with subsequent striking against other object, initial encounter: Secondary | ICD-10-CM | POA: Insufficient documentation

## 2015-06-28 DIAGNOSIS — F329 Major depressive disorder, single episode, unspecified: Secondary | ICD-10-CM | POA: Diagnosis not present

## 2015-06-28 DIAGNOSIS — Y998 Other external cause status: Secondary | ICD-10-CM | POA: Diagnosis not present

## 2015-06-28 DIAGNOSIS — Y9389 Activity, other specified: Secondary | ICD-10-CM | POA: Diagnosis not present

## 2015-06-28 DIAGNOSIS — Z720 Tobacco use: Secondary | ICD-10-CM | POA: Diagnosis not present

## 2015-06-28 DIAGNOSIS — S0101XA Laceration without foreign body of scalp, initial encounter: Secondary | ICD-10-CM

## 2015-06-28 DIAGNOSIS — Z87448 Personal history of other diseases of urinary system: Secondary | ICD-10-CM | POA: Insufficient documentation

## 2015-06-28 DIAGNOSIS — Y9289 Other specified places as the place of occurrence of the external cause: Secondary | ICD-10-CM | POA: Insufficient documentation

## 2015-06-28 DIAGNOSIS — W19XXXA Unspecified fall, initial encounter: Secondary | ICD-10-CM

## 2015-06-28 DIAGNOSIS — Z79899 Other long term (current) drug therapy: Secondary | ICD-10-CM | POA: Insufficient documentation

## 2015-06-28 DIAGNOSIS — S0990XA Unspecified injury of head, initial encounter: Secondary | ICD-10-CM | POA: Diagnosis present

## 2015-06-28 MED ORDER — KETOROLAC TROMETHAMINE 60 MG/2ML IM SOLN
60.0000 mg | Freq: Once | INTRAMUSCULAR | Status: DC
  Filled 2015-06-28: qty 2

## 2015-06-28 NOTE — ED Provider Notes (Signed)
CSN: [id number]     Arrival date & time 06/28/15  1546 History   First MD Initiated Contact with Patient 06/28/15 1737     Chief Complaint  Patient presents with  . Fall     (Consider location/radiation/quality/duration/timing/severity/associated sxs/prior Treatment) Patient is a 39 y.o. female presenting with fall. The history is provided by the patient.  Fall Associated symptoms include headaches. Pertinent negatives include no chest pain, no abdominal pain and no shortness of breath.   patient with mechanical fall. Hit her head. Not complaining of a headache. Also pain in her right shoulder. Fall was today. No loss of consciousness. No neck pain. No numbness weakness. She is tearful on my examination. Last tetanus was 5 years ago.  Past Medical History  Diagnosis Date  . Depression   . Cyst of kidney, acquired   . Suicide and self-inflicted injury   . Overdose    Past Surgical History  Procedure Laterality Date  . Ovarian cyst removal    . Abdominal hysterectomy    . Cesarean section     No family history on file. Social History  Substance Use Topics  . Smoking status: Current Every Day Smoker -- 0.50 packs/day    Types: Cigarettes  . Smokeless tobacco: None  . Alcohol Use: No   OB History    No data available     Review of Systems  Constitutional: Negative for activity change.  Respiratory: Negative for shortness of breath.   Cardiovascular: Negative for chest pain.  Gastrointestinal: Negative for abdominal pain.  Musculoskeletal:       Right shoulder pain  Skin: Positive for wound.  Neurological: Positive for headaches.      Allergies  Sulfa antibiotics  Home Medications   Prior to Admission medications   Medication Sig Start Date End Date Taking? Authorizing Provider  clonazePAM (KLONOPIN) 1 MG tablet Take 1 mg by mouth 3 (three) times daily as needed for anxiety.   Yes Historical Provider, MD  FLUoxetine (PROZAC) 20 MG capsule Take 40 mg by mouth  daily.   Yes Historical Provider, MD  gabapentin (NEURONTIN) 300 MG capsule Take 300 mg by mouth 3 (three) times daily.   Yes Historical Provider, MD  albuterol (PROVENTIL HFA;VENTOLIN HFA) 108 (90 BASE) MCG/ACT inhaler Inhale 2 puffs into the lungs every 6 (six) hours. For shortness of breath Patient not taking: Reported on 06/28/2015 02/02/13   Sanjuana Kava, NP  Alum & Mag Hydroxide-Simeth (MAGIC MOUTHWASH W/LIDOCAINE) SOLN Take 5 mLs by mouth 3 (three) times daily. Swish and spit, do not swallow Patient not taking: Reported on 06/28/2015 06/28/13   Tammy Triplett, PA-C  citalopram (CELEXA) 20 MG tablet Take 1 tablet (20 mg total) by mouth daily. For depression Patient not taking: Reported on 06/28/2015 02/02/13   Sanjuana Kava, NP  clindamycin (CLEOCIN) 150 MG capsule Take 2 capsules (300 mg total) by mouth 4 (four) times daily. For 7 days Patient not taking: Reported on 06/28/2015 06/28/13   Tammy Triplett, PA-C  potassium chloride SA (K-DUR,KLOR-CON) 20 MEQ tablet Take 1 tablet (20 mEq total) by mouth daily. For potassium supplement Patient not taking: Reported on 06/28/2015 02/02/13   Sanjuana Kava, NP  risperiDONE (RISPERDAL) 1 MG tablet Take 1 tablet (1 mg total) by mouth 2 (two) times daily. For mood control Patient not taking: Reported on 06/28/2015 02/02/13   Sanjuana Kava, NP  traZODone (DESYREL) 100 MG tablet Take 1 tablet (100 mg total) by mouth at bedtime as  needed for sleep. For depression/sleep Patient not taking: Reported on 06/28/2015 02/02/13   Sanjuana Kava, NP   BP 114/70 mmHg  Pulse 74  Temp(Src) 98.1 F (36.7 C) (Oral)  Resp 20  Ht  (1.6 m)  Wt 166 lb (75.297 kg)  BMI 29.41 kg/m2  SpO2 97%  LMP 06/21/2011 Physical Exam  Constitutional: She appears well-developed.  HENT:  Head: Normocephalic.  1.5 cm laceration to right parietal area with surrounding tenderness. Not actively bleeding.  Neck: Neck supple.  Cardiovascular: Normal rate.   Pulmonary/Chest: Effort normal.   Abdominal: Soft.  Musculoskeletal: She exhibits tenderness.  Tenderness right shoulder laterally. Decreased range of motion due to pain. No tenderness over the elbow. No deformity.  Skin: Skin is warm.  Psychiatric: She has a normal mood and affect.    ED Course  Procedures (including critical care time) Labs Review Labs Reviewed - No data to display  Imaging Review Dg Shoulder Right  06/28/2015   CLINICAL DATA:  Patient was walking outside this morning. Tripped and fell onto a concrete surface. Right shoulder and proximal humerus pain since the fall.  EXAM: RIGHT SHOULDER - 2+ VIEW  COMPARISON:  12/25/2010  FINDINGS: There is no evidence of fracture or dislocation. There is no evidence of arthropathy or other focal bone abnormality. Soft tissues are unremarkable.  IMPRESSION: Negative.   Electronically Signed   By: Norva Pavlov M.D.   On: 06/28/2015 18:18   Ct Head Wo Contrast  06/28/2015   CLINICAL DATA:  39 year old who fell earlier today and struck her head on cement, sustaining a small laceration on the right side of the head. Initial encounter.  EXAM: CT HEAD WITHOUT CONTRAST  TECHNIQUE: Contiguous axial images were obtained from the base of the skull through the vertex without intravenous contrast.  COMPARISON:  01/23/2013.  FINDINGS: Ventricular system normal in size and appearance for age. No mass lesion. No midline shift. No acute hemorrhage or hematoma. No extra-axial fluid collections. No evidence of acute infarction. No change since the prior examination.  Marked thickening of the wall of the left maxillary sinus with mild mucosal thickening, unchanged. Remaining visualized paranasal sinuses, bilateral mastoid air cells and bilateral middle ear cavities well-aerated.  IMPRESSION: 1. Normal intracranially. 2. Chronic left maxillary sinusitis.   Electronically Signed   By: Hulan Saas M.D.   On: 06/28/2015 18:57   I have personally reviewed and evaluated these images and lab  results as part of my medical decision-making.   EKG Interpretation None      MDM   Final diagnoses:  Fall, initial encounter  Scalp laceration, initial encounter    Patient with fall. Scalp laceration was closed with one staple. Head CT reassuring. Patient refused Toradol for her headache at the site of the laceration. She became belligerent and was yelling. Patient was escorted out by security.  LACERATION REPAIR Performed by: Billee Cashing Authorized by: Billee Cashing Consent: Verbal consent obtained. Risks and benefits: risks, benefits and alternatives were discussed Consent given by: patient Patient identity confirmed: provided demographic data Prepped and Draped in normal sterile fashion Wound explored  Laceration Location: scalp  Laceration Length: 1.5cm  No Foreign Bodies seen or palpated  Irrigation method: syringe Amount of cleaning: standard  Skin closure: staple  Number of staples: 1  Technique:staple  Patient tolerance: Patient tolerated the procedure well with no immediate complications.    Benjiman Core, MD 06/28/15 [phone]

## 2015-06-28 NOTE — ED Notes (Signed)
Pt reports falling this morning and hitting head on cement. Small laceration on right side of head. Bleeding controlled.

## 2015-06-28 NOTE — Discharge Instructions (Signed)
Stitches, Staples, or Skin Adhesive Strips  °Stitches (sutures), staples, and skin adhesive strips hold the skin together as it heals. They will usually be in place for 7 days or less. °HOME CARE °· Wash your hands with soap and water before and after you touch your wound. °· Only take medicine as told by your doctor. °· Cover your wound only if your doctor told you to. Otherwise, leave it open to air. °· Do not get your stitches wet or dirty. If they get dirty, dab them gently with a clean washcloth. Wet the washcloth with soapy water. Do not rub. Pat them dry gently. °· Do not put medicine or medicated cream on your stitches unless your doctor told you to. °· Do not take out your own stitches or staples. Skin adhesive strips will fall off by themselves. °· Do not pick at the wound. Picking can cause an infection. °· Do not miss your follow-up appointment. °· If you have problems or questions, call your doctor. °GET HELP RIGHT AWAY IF:  °· You have a temperature by mouth above 102° F (38.9° C), not controlled by medicine. °· You have chills. °· You have redness or pain around your stitches. °· There is puffiness (swelling) around your stitches. °· You notice fluid (drainage) from your stitches. °· There is a bad smell coming from your wound. °MAKE SURE YOU: °· Understand these instructions. °· Will watch your condition. °· Will get help if you are not doing well or get worse. °Document Released: 08/03/2009 Document Revised: 12/29/2011 Document Reviewed: 08/03/2009 °[employer]® Patient Information ©2015 [employer]. This information is not intended to replace advice given to you by your health care provider. Make sure you discuss any questions you have with your health care provider. ° °

## 2018-04-15 ENCOUNTER — Encounter (HOSPITAL_COMMUNITY): Payer: Self-pay | Admitting: Emergency Medicine

## 2018-04-15 ENCOUNTER — Emergency Department (HOSPITAL_COMMUNITY)
Admission: EM | Admit: 2018-04-15 | Discharge: 2018-04-15 | Disposition: A | Discharge status: Home / Self Care | Payer: Medicaid Other | Attending: Emergency Medicine | Admitting: Emergency Medicine

## 2018-04-15 ENCOUNTER — Emergency Department (HOSPITAL_COMMUNITY): Payer: Medicaid Other

## 2018-04-15 ENCOUNTER — Other Ambulatory Visit: Payer: Self-pay

## 2018-04-15 DIAGNOSIS — Z79899 Other long term (current) drug therapy: Secondary | ICD-10-CM | POA: Diagnosis not present

## 2018-04-15 DIAGNOSIS — N3001 Acute cystitis with hematuria: Secondary | ICD-10-CM | POA: Insufficient documentation

## 2018-04-15 DIAGNOSIS — F17210 Nicotine dependence, cigarettes, uncomplicated: Secondary | ICD-10-CM | POA: Diagnosis not present

## 2018-04-15 DIAGNOSIS — R102 Pelvic and perineal pain: Secondary | ICD-10-CM | POA: Diagnosis present

## 2018-04-15 LAB — COMPREHENSIVE METABOLIC PANEL
ALBUMIN: 4.1 g/dL (ref 3.5–5.0)
ALT: 20 U/L (ref 0–44)
AST: 16 U/L (ref 15–41)
Alkaline Phosphatase: 90 U/L (ref 38–126)
Anion gap: 8 (ref 5–15)
BUN: 14 mg/dL (ref 6–20)
CHLORIDE: 105 mmol/L (ref 98–111)
CO2: 23 mmol/L (ref 22–32)
Calcium: 9.2 mg/dL (ref 8.9–10.3)
Creatinine, Ser: 1.05 mg/dL — ABNORMAL HIGH (ref 0.44–1.00)
GFR calc Af Amer: >60 mL/min (ref >60)
GFR calc non Af Amer: >60 mL/min (ref >60)
GLUCOSE: 109 mg/dL — AB (ref 70–99)
POTASSIUM: 3.7 mmol/L (ref 3.5–5.1)
Sodium: 136 mmol/L (ref 135–145)
Total Bilirubin: 0.3 mg/dL (ref 0.3–1.2)
Total Protein: 7.7 g/dL (ref 6.5–8.1)

## 2018-04-15 LAB — LIPASE, BLOOD: Lipase: 30 U/L (ref 11–51)

## 2018-04-15 LAB — CBC WITH DIFFERENTIAL/PLATELET
BASOS ABS: 0.1 10*3/uL (ref 0.0–0.1)
BASOS PCT: 0 %
EOS ABS: 0.2 10*3/uL (ref 0.0–0.7)
EOS PCT: 1 %
HCT: 41 % (ref 36.0–46.0)
Hemoglobin: 13.8 g/dL (ref 12.0–15.0)
Lymphocytes Relative: 24 %
Lymphs Abs: 4.4 10*3/uL — ABNORMAL HIGH (ref 0.7–4.0)
MCH: 31.4 pg (ref 26.0–34.0)
MCHC: 33.7 g/dL (ref 30.0–36.0)
MCV: 93.2 fL (ref 78.0–100.0)
MONO ABS: 1.2 10*3/uL — AB (ref 0.1–1.0)
Monocytes Relative: 7 %
Neutro Abs: 12.3 10*3/uL — ABNORMAL HIGH (ref 1.7–7.7)
Neutrophils Relative %: 68 %
PLATELETS: 316 10*3/uL (ref 150–400)
RBC: 4.4 MIL/uL (ref 3.87–5.11)
RDW: 12.9 % (ref 11.5–15.5)
WBC: 18.2 10*3/uL — ABNORMAL HIGH (ref 4.0–10.5)

## 2018-04-15 LAB — URINALYSIS, ROUTINE W REFLEX MICROSCOPIC
BACTERIA UA: NONE SEEN
BILIRUBIN URINE: NEGATIVE
GLUCOSE, UA: NEGATIVE mg/dL
Ketones, ur: NEGATIVE mg/dL
NITRITE: NEGATIVE
Protein, ur: 100 mg/dL — AB
Specific Gravity, Urine: 1.009 (ref 1.005–1.030)
WBC, UA: >50 WBC/hpf — ABNORMAL HIGH (ref 0–5)
pH: 6 (ref 5.0–8.0)

## 2018-04-15 LAB — WET PREP, GENITAL
Clue Cells Wet Prep HPF POC: NONE SEEN
SPERM: NONE SEEN
Trich, Wet Prep: NONE SEEN
YEAST WET PREP: NONE SEEN

## 2018-04-15 MED ORDER — SODIUM CHLORIDE 0.9 % IV SOLN
1.0000 g | Freq: Once | INTRAVENOUS | Status: AC
  Administered 2018-04-15: 1 g via INTRAVENOUS
  Filled 2018-04-15: qty 10

## 2018-04-15 MED ORDER — CEPHALEXIN 500 MG PO CAPS: 500.0000 mg | ORAL_CAPSULE | Freq: Four times a day (QID) | ORAL | 0 refills | Status: DC

## 2018-04-15 MED ORDER — MORPHINE SULFATE (PF) 4 MG/ML IV SOLN
4.0000 mg | Freq: Once | INTRAVENOUS | Status: AC
Start: 2018-04-15 — End: 2018-04-15
  Administered 2018-04-15: 4 mg via INTRAVENOUS
  Filled 2018-04-15: qty 1

## 2018-04-15 MED ORDER — PHENAZOPYRIDINE HCL 100 MG PO TABS
200.0000 mg | ORAL_TABLET | Freq: Once | ORAL | Status: AC
  Administered 2018-04-15: 200 mg via ORAL
  Filled 2018-04-15: qty 2

## 2018-04-15 MED ORDER — ONDANSETRON 4 MG PO TBDP: 4.0000 mg | ORAL_TABLET | Freq: Three times a day (TID) | ORAL | 0 refills | Status: DC | PRN

## 2018-04-15 MED ORDER — HYDROCODONE-ACETAMINOPHEN 5-325 MG PO TABS: 1.0000 | ORAL_TABLET | ORAL | 0 refills | Status: DC | PRN

## 2018-04-15 MED ORDER — MORPHINE SULFATE (PF) 4 MG/ML IV SOLN
4.0000 mg | Freq: Once | INTRAVENOUS | Status: AC
  Administered 2018-04-15: 4 mg via INTRAVENOUS
  Filled 2018-04-15: qty 1

## 2018-04-15 MED ORDER — PHENAZOPYRIDINE HCL 200 MG PO TABS: 200.0000 mg | ORAL_TABLET | Freq: Three times a day (TID) | ORAL | 0 refills | Status: DC

## 2018-04-15 MED ORDER — ONDANSETRON HCL 4 MG/2ML IJ SOLN
4.0000 mg | Freq: Once | INTRAMUSCULAR | Status: AC
Start: 2018-04-15 — End: 2018-04-15
  Administered 2018-04-15: 4 mg via INTRAVENOUS
  Filled 2018-04-15: qty 2

## 2018-04-15 MED ORDER — SODIUM CHLORIDE 0.9 % IV BOLUS
1000.0000 mL | Freq: Once | INTRAVENOUS | Status: AC
  Administered 2018-04-15: 1000 mL via INTRAVENOUS

## 2018-04-15 NOTE — ED Notes (Signed)
EDP at bedside  

## 2018-04-15 NOTE — ED Notes (Signed)
Pelvic setup at bedside.

## 2018-04-15 NOTE — ED Provider Notes (Signed)
[facility] EMERGENCY DEPARTMENT Provider Note   CSN: [id number] Arrival date & time: 04/15/18  1802     History   Chief Complaint Chief Complaint  Patient presents with  . Pelvic Pain    HPI [name] is a 42 y.o. female.  Pt presents to the ED today with lower abdominal pressure.  The pt pt said sx have been going on since yesterday.  The pt c/o urinary frequency and dysuria.  She denies f/c.  She took a few old abx to see if that would help, but it has not.  She has a lot of bladder spasms which cause her to be incontinent of urine.     Past Medical History:  Diagnosis Date  . Cyst of kidney, acquired   . Depression   . Overdose   . Suicide and self-inflicted injury [facility])     Patient Active Problem List   Diagnosis Date Noted  . Bipolar I disorder, most recent episode (or current) depressed, severe, without mention of psychotic behavior 01/28/2013  . Substance abuse (HCC) 01/28/2013  . Anxiety state, unspecified 01/28/2013    Past Surgical History:  Procedure Laterality Date  . ABDOMINAL HYSTERECTOMY    . CESAREAN SECTION    . OVARIAN CYST REMOVAL       OB History   None      Home Medications    Prior to Admission medications   Medication Sig Start Date End Date Taking? Authorizing Provider  citalopram (CELEXA) 20 MG tablet Take 1 tablet (20 mg total) by mouth daily. For depression 02/02/13  Yes Nwoko, Nicole Kindred I, NP  levothyroxine (SYNTHROID, LEVOTHROID) 25 MCG tablet Take 25 mcg by mouth daily before breakfast.   Yes [provider]  QUEtiapine (SEROQUEL) 50 MG tablet Take 50 mg by mouth at bedtime.   Yes [provider]  cephALEXin (KEFLEX) 500 MG capsule Take 1 capsule (500 mg total) by mouth 4 (four) times daily. 04/15/18   Jacalyn Lefevre, MD  HYDROcodone-acetaminophen (NORCO/VICODIN) 5-325 MG tablet Take 1 tablet by mouth every 4 (four) hours as needed. 04/15/18   Jacalyn Lefevre, MD  ondansetron (ZOFRAN ODT) 4 MG disintegrating  tablet Take 1 tablet (4 mg total) by mouth every 8 (eight) hours as needed. 04/15/18   Jacalyn Lefevre, MD  phenazopyridine (PYRIDIUM) 200 MG tablet Take 1 tablet (200 mg total) by mouth 3 (three) times daily. 04/15/18   Jacalyn Lefevre, MD    Family History No family history on file.  Social History Social History   Tobacco Use  . Smoking status: Current Every Day Smoker    Packs/day: 0.50    Types: Cigarettes  . Smokeless tobacco: Never Used  Substance Use Topics  . Alcohol use: Yes  . Drug use: No     Allergies   Sulfa antibiotics   Review of Systems Review of Systems  Genitourinary: Positive for dysuria, frequency and pelvic pain.  All other systems reviewed and are negative.    Physical Exam Updated Vital Signs BP (S) (!) 131/103 (BP Location: Right Arm)   Pulse (S) (!) 110   Temp 98.9 F (37.2 C) (Oral)   Resp 18   Ht 5\' 3"  (1.6 m)   Wt 72.6 kg (160 lb)   LMP 06/21/2011   SpO2 100%   BMI 28.34 kg/m   Physical Exam  Constitutional: She is oriented to person, place, and time. She appears well-developed. She appears distressed.  HENT:  Head: Normocephalic and atraumatic.  Right Ear:  External ear normal.  Left Ear: External ear normal.  Nose: Nose normal.  Mouth/Throat: Oropharynx is clear and moist.  Eyes: Pupils are equal, round, and reactive to light. Conjunctivae and EOM are normal.  Neck: Normal range of motion. Neck supple.  Cardiovascular: Regular rhythm, normal heart sounds and intact distal pulses. Tachycardia present.  Pulmonary/Chest: Effort normal and breath sounds normal.  Abdominal: Soft. Bowel sounds are normal. There is tenderness in the suprapubic area.  Genitourinary: Vagina normal. There is no tenderness on the right labia. There is no tenderness on the left labia. No vaginal discharge found.  Musculoskeletal: Normal range of motion.  Neurological: She is alert and oriented to person, place, and time.  Skin: Skin is warm. Capillary  refill takes less than 2 seconds.  Psychiatric: She has a normal mood and affect. Her behavior is normal. Judgment and thought content normal.  Nursing note and vitals reviewed.    ED Treatments / Results  Labs (all labs ordered are listed, but only abnormal results are displayed) Labs Reviewed  WET PREP, GENITAL - Abnormal; Notable for the following components:      Result Value   WBC, Wet Prep HPF POC FEW (*)    All other components within normal limits  CBC WITH DIFFERENTIAL/PLATELET - Abnormal; Notable for the following components:   WBC 18.2 (*)    Neutro Abs 12.3 (*)    Lymphs Abs 4.4 (*)    Monocytes Absolute 1.2 (*)    All other components within normal limits  COMPREHENSIVE METABOLIC PANEL - Abnormal; Notable for the following components:   Glucose, Bld 109 (*)    Creatinine, Ser 1.05 (*)    All other components within normal limits  URINALYSIS, ROUTINE W REFLEX MICROSCOPIC - Abnormal; Notable for the following components:   APPearance CLOUDY (*)    Hgb urine dipstick MODERATE (*)    Protein, ur 100 (*)    Leukocytes, UA LARGE (*)    WBC, UA >50 (*)    Non Squamous Epithelial 0-5 (*)    All other components within normal limits  LIPASE, BLOOD  GC/CHLAMYDIA PROBE AMP (Marissa) NOT AT [facility]    EKG None  Radiology Ct Renal Stone Study  Result Date: 04/15/2018 CLINICAL DATA:  Flank pain.  Lower abdominal pain.  Back pain. EXAM: CT ABDOMEN AND PELVIS WITHOUT CONTRAST TECHNIQUE: Multidetector CT imaging of the abdomen and pelvis was performed following the standard protocol without IV contrast. COMPARISON:  01/23/2013 CT abdomen/pelvis. FINDINGS: Lower chest: No significant pulmonary nodules or acute consolidative airspace disease. Hepatobiliary: Normal liver size. No liver mass. Normal gallbladder with no radiopaque cholelithiasis. No biliary ductal dilatation. Pancreas: Normal, with no mass or duct dilation. Spleen: Normal size. No mass. Adrenals/Urinary Tract:  Normal adrenals. No renal stones. Fullness of the central right renal collecting system without overt right hydronephrosis. Mild urothelial wall thickening in the right renal pelvis. Fat stranding surrounding the right renal pelvis with diffuse right periureteric fat stranding. No left hydronephrosis. No contour deforming renal masses. No ureteral stones. Normal caliber ureters. Mild diffuse bladder wall thickening with mild haziness of the perivesical fat. No bladder stones. Stomach/Bowel: Normal non-distended stomach. Normal caliber small bowel with no small bowel wall thickening. Normal appendix. Normal large bowel with no diverticulosis, large bowel wall thickening or pericolonic fat stranding. Vascular/Lymphatic: Mildly atherosclerotic nonaneurysmal abdominal aorta. No pathologically enlarged lymph nodes in the abdomen or pelvis. Reproductive: Status post hysterectomy, with no abnormal findings at the vaginal cuff. No adnexal  mass. Other: No pneumoperitoneum, ascites or focal fluid collection. Musculoskeletal: No aggressive appearing focal osseous lesions. IMPRESSION: 1. No urolithiasis. 2. Mild diffuse bladder wall thickening. Haziness of the perivesical fat. Findings suggest acute cystitis. Recommend correlation with urinalysis. 3. Right periureteric fat stranding. Mild urothelial wall thickening in the right renal pelvis. Findings suggest ascending right urinary tract infection. 4.  Aortic Atherosclerosis (ICD10-I70.0). Electronically Signed   By: Delbert Phenix M.D.   On: 04/15/2018 21:34    Procedures Procedures (including critical care time)  Medications Ordered in ED Medications  cefTRIAXone (ROCEPHIN) 1 g in sodium chloride 0.9 % 100 mL IVPB (has no administration in time range)  sodium chloride 0.9 % bolus 1,000 mL (0 mLs Intravenous Stopped 04/15/18 2012)  ondansetron (ZOFRAN) injection 4 mg (4 mg Intravenous Given 04/15/18 1833)  morphine 4 MG/ML injection 4 mg (4 mg Intravenous Given 04/15/18  1833)  phenazopyridine (PYRIDIUM) tablet 200 mg (200 mg Oral Given 04/15/18 1846)  morphine 4 MG/ML injection 4 mg (4 mg Intravenous Given 04/15/18 2100)     Initial Impression / Assessment and Plan / ED Course  I have reviewed the triage vital signs and the nursing notes.  Pertinent labs & imaging results that were available during my care of the patient were reviewed by me and considered in my medical decision making (see chart for details).    Pt is feeling much better.  She is given a dose of rocephin and will be d/c home with keflex, zofran, pyridium, and lortab.  Return if worse.  Final Clinical Impressions(s) / ED Diagnoses   Final diagnoses:  Acute cystitis with hematuria    ED Discharge Orders        Ordered    cephALEXin (KEFLEX) 500 MG capsule  4 times daily     04/15/18 2148    phenazopyridine (PYRIDIUM) 200 MG tablet  3 times daily     04/15/18 2148    HYDROcodone-acetaminophen (NORCO/VICODIN) 5-325 MG tablet  Every 4 hours PRN     04/15/18 2148    ondansetron (ZOFRAN ODT) 4 MG disintegrating tablet  Every 8 hours PRN     04/15/18 2148       Jacalyn Lefevre, MD 04/15/18 2148

## 2018-04-15 NOTE — ED Triage Notes (Signed)
Pt c/o of pressure in her lower abdomen and pelvis since yesterday.

## 2018-04-16 LAB — GC/CHLAMYDIA PROBE AMP (~~LOC~~) NOT AT ARMC
Chlamydia: NEGATIVE
Neisseria Gonorrhea: NEGATIVE

## 2018-06-22 ENCOUNTER — Other Ambulatory Visit (HOSPITAL_COMMUNITY): Payer: Self-pay | Admitting: Family Medicine

## 2018-06-22 ENCOUNTER — Ambulatory Visit (HOSPITAL_COMMUNITY)
Admission: RE | Admit: 2018-06-22 | Discharge: 2018-06-22 | Disposition: A | Discharge status: Home / Self Care | Payer: Medicaid Other | Source: Ambulatory Visit | Attending: Family Medicine | Admitting: Family Medicine

## 2018-06-22 DIAGNOSIS — M545 Low back pain: Secondary | ICD-10-CM | POA: Insufficient documentation

## 2018-06-22 DIAGNOSIS — G8929 Other chronic pain: Secondary | ICD-10-CM | POA: Diagnosis not present

## 2020-06-17 ENCOUNTER — Other Ambulatory Visit: Payer: Self-pay

## 2020-06-17 ENCOUNTER — Emergency Department (HOSPITAL_COMMUNITY)
Admission: EM | Admit: 2020-06-17 | Discharge: 2020-06-17 | Disposition: A | Discharge status: Home / Self Care | Payer: Medicaid Other | Attending: Emergency Medicine | Admitting: Emergency Medicine

## 2020-06-17 ENCOUNTER — Encounter (HOSPITAL_COMMUNITY): Payer: Self-pay | Admitting: Emergency Medicine

## 2020-06-17 DIAGNOSIS — S0501XA Injury of conjunctiva and corneal abrasion without foreign body, right eye, initial encounter: Secondary | ICD-10-CM

## 2020-06-17 DIAGNOSIS — S0591XA Unspecified injury of right eye and orbit, initial encounter: Secondary | ICD-10-CM | POA: Diagnosis present

## 2020-06-17 DIAGNOSIS — Y9389 Activity, other specified: Secondary | ICD-10-CM | POA: Diagnosis not present

## 2020-06-17 DIAGNOSIS — E0789 Other specified disorders of thyroid: Secondary | ICD-10-CM | POA: Diagnosis not present

## 2020-06-17 DIAGNOSIS — Z79899 Other long term (current) drug therapy: Secondary | ICD-10-CM | POA: Insufficient documentation

## 2020-06-17 DIAGNOSIS — Y9289 Other specified places as the place of occurrence of the external cause: Secondary | ICD-10-CM | POA: Insufficient documentation

## 2020-06-17 DIAGNOSIS — Y999 Unspecified external cause status: Secondary | ICD-10-CM | POA: Diagnosis not present

## 2020-06-17 DIAGNOSIS — X58XXXA Exposure to other specified factors, initial encounter: Secondary | ICD-10-CM | POA: Insufficient documentation

## 2020-06-17 DIAGNOSIS — F17210 Nicotine dependence, cigarettes, uncomplicated: Secondary | ICD-10-CM | POA: Insufficient documentation

## 2020-06-17 DIAGNOSIS — Z79890 Hormone replacement therapy (postmenopausal): Secondary | ICD-10-CM | POA: Diagnosis not present

## 2020-06-17 HISTORY — DX: Disorder of thyroid, unspecified: E07.9

## 2020-06-17 MED ORDER — KETOROLAC TROMETHAMINE 0.5 % OP SOLN
1.0000 [drp] | Freq: Four times a day (QID) | OPHTHALMIC | Status: DC
  Administered 2020-06-17: 1 [drp] via OPHTHALMIC
  Filled 2020-06-17: qty 5

## 2020-06-17 MED ORDER — ERYTHROMYCIN 5 MG/GM OP OINT
TOPICAL_OINTMENT | Freq: Three times a day (TID) | OPHTHALMIC | Status: DC
  Filled 2020-06-17: qty 3.5

## 2020-06-17 MED ORDER — FLUORESCEIN SODIUM 1 MG OP STRP
1.0000 | ORAL_STRIP | Freq: Once | OPHTHALMIC | Status: AC
  Administered 2020-06-17: 1 via OPHTHALMIC
  Filled 2020-06-17: qty 1

## 2020-06-17 MED ORDER — TETRACAINE HCL 0.5 % OP SOLN
2.0000 [drp] | Freq: Once | OPHTHALMIC | Status: AC
  Administered 2020-06-17: 2 [drp] via OPHTHALMIC
  Filled 2020-06-17: qty 4

## 2020-06-17 NOTE — ED Triage Notes (Signed)
Pt reports right eye pain. Pt states she was weed eating with no safety glasses and thinks "something may have gotten into my eye."

## 2020-06-17 NOTE — Discharge Instructions (Signed)
Take the drops and ointment to help with the pain and treat possible infection.  Go to the ophthalmology office at 930 tomorrow to see Dr. Allena Katz.

## 2020-06-17 NOTE — ED Provider Notes (Signed)
[facility] EMERGENCY DEPARTMENT Provider Note   CSN: [id number] Arrival date & time: 06/17/20  0175     History Chief Complaint  Patient presents with  . Eye Pain    [name] is a 44 y.o. female.  HPI Patient presents with right eye pain.  Began last night.  Had been using a weed eater yesterday without safety goggles.  Think something could have gotten into her eye although she does not know of anything that did get in her eye.  Pain is in more the upper part of the eye and goes up her head.  The light bothers her.  No drainage.  No fevers.  States she thinks the vision is okay in the eye the light does bother her.  Has not had problems with this before.    Past Medical History:  Diagnosis Date  . Cyst of kidney, acquired   . Depression   . Overdose   . Suicide and self-inflicted injury (HCC)   . Thyroid disease     Patient Active Problem List   Diagnosis Date Noted  . Bipolar I disorder, most recent episode (or current) depressed, severe, without mention of psychotic behavior 01/28/2013  . Substance abuse (HCC) 01/28/2013  . Anxiety state, unspecified 01/28/2013    Past Surgical History:  Procedure Laterality Date  . ABDOMINAL HYSTERECTOMY    . CESAREAN SECTION    . OVARIAN CYST REMOVAL       OB History   No obstetric history on file.     No family history on file.  Social History   Tobacco Use  . Smoking status: Current Every Day Smoker    Packs/day: 0.50    Types: Cigarettes  . Smokeless tobacco: Never Used  Substance Use Topics  . Alcohol use: Yes  . Drug use: No    Home Medications Prior to Admission medications   Medication Sig Start Date End Date Taking? Authorizing Provider  cephALEXin (KEFLEX) 500 MG capsule Take 1 capsule (500 mg total) by mouth 4 (four) times daily. 04/15/18   Jacalyn Lefevre, MD  citalopram (CELEXA) 20 MG tablet Take 1 tablet (20 mg total) by mouth daily. For depression 02/02/13   Armandina Stammer I, NP    HYDROcodone-acetaminophen (NORCO/VICODIN) 5-325 MG tablet Take 1 tablet by mouth every 4 (four) hours as needed. 04/15/18   Jacalyn Lefevre, MD  levothyroxine (SYNTHROID, LEVOTHROID) 25 MCG tablet Take 25 mcg by mouth daily before breakfast.    [provider]  ondansetron (ZOFRAN ODT) 4 MG disintegrating tablet Take 1 tablet (4 mg total) by mouth every 8 (eight) hours as needed. 04/15/18   Jacalyn Lefevre, MD  phenazopyridine (PYRIDIUM) 200 MG tablet Take 1 tablet (200 mg total) by mouth 3 (three) times daily. 04/15/18   Jacalyn Lefevre, MD  QUEtiapine (SEROQUEL) 50 MG tablet Take 50 mg by mouth at bedtime.    [provider]    Allergies    Sulfa antibiotics  Review of Systems   Review of Systems  HENT: Negative for congestion.   Eyes: Positive for photophobia, pain and redness. Negative for discharge and visual disturbance.  Respiratory: Negative for shortness of breath.   Neurological: Positive for headaches. Negative for weakness.    Physical Exam Updated Vital Signs BP 121/76 (BP Location: Left Arm)   Pulse 72   Temp 98 F (36.7 C) (Oral)   Resp 18   Ht 5\' 3"  (1.6 m)   Wt 68 kg   LMP 06/21/2011  SpO2 99%   BMI 26.57 kg/m   Physical Exam Vitals reviewed.  HENT:     Head: Atraumatic.  Eyes:     Comments: Eye movements intact.  Somewhat difficult exam as patient is hesitant to open her eye.  Intraocular pressure appears to be around 23 although difficult to obtain.  Slit lamp exam does show some fluorescein uptake at around the 9 or 10 o'clock position of the cornea.  Cardiovascular:     Rate and Rhythm: Normal rate.  Skin:    General: Skin is warm.     Capillary Refill: Capillary refill takes less than 2 seconds.     Findings: No rash.  Neurological:     Mental Status: She is alert and oriented to person, place, and time.     ED Results / Procedures / Treatments   Labs (all labs ordered are listed, but only abnormal results are displayed) Labs  Reviewed - No data to display  EKG None  Radiology No results found.  Procedures Procedures (including critical care time)  Medications Ordered in ED Medications  ketorolac (ACULAR) 0.5 % ophthalmic solution 1 drop (has no administration in time range)  erythromycin ophthalmic ointment (has no administration in time range)  tetracaine (PONTOCAINE) 0.5 % ophthalmic solution 2 drop (2 drops Right Eye Given by Other 06/17/20 0802)  fluorescein ophthalmic strip 1 strip (1 strip Right Eye Given by Other 06/17/20 0454)    ED Course  I have reviewed the triage vital signs and the nursing notes.  Pertinent labs & imaging results that were available during my care of the patient were reviewed by me and considered in my medical decision making (see chart for details).    MDM Rules/Calculators/A&P                          Patient with eye pain.  Likely had had a foreign body or irritation from weed whacking.  Does have some corneal uptake.  Also mildly elevated intraocular pressure.  Vision appears grossly intact but somewhat difficult as patient is hesitant to open the eye.  Feels somewhat better after tetracaine drop.  Discussed with Dr. Allena Katz who will see in the office at 930 tomorrow.  Will give Acular drops and erythromycin. Final Clinical Impression(s) / ED Diagnoses Final diagnoses:  Abrasion of right cornea, initial encounter    Rx / DC Orders ED Discharge Orders    None       Benjiman Core, MD 06/17/20 [id number]

## 2020-06-17 NOTE — ED Notes (Signed)
Morgan lens inserted into right eye and it is currently being irrigated with 1L of NS.

## 2020-06-29 ENCOUNTER — Encounter (HOSPITAL_COMMUNITY): Payer: Self-pay

## 2020-06-29 ENCOUNTER — Emergency Department (HOSPITAL_COMMUNITY)
Admission: EM | Admit: 2020-06-29 | Discharge: 2020-06-29 | Disposition: A | Discharge status: Home / Self Care | Payer: Medicaid Other | Attending: Emergency Medicine | Admitting: Emergency Medicine

## 2020-06-29 ENCOUNTER — Other Ambulatory Visit: Payer: Self-pay

## 2020-06-29 ENCOUNTER — Emergency Department (HOSPITAL_COMMUNITY): Payer: Medicaid Other

## 2020-06-29 DIAGNOSIS — L539 Erythematous condition, unspecified: Secondary | ICD-10-CM | POA: Insufficient documentation

## 2020-06-29 DIAGNOSIS — J069 Acute upper respiratory infection, unspecified: Secondary | ICD-10-CM | POA: Diagnosis not present

## 2020-06-29 DIAGNOSIS — Z79899 Other long term (current) drug therapy: Secondary | ICD-10-CM | POA: Diagnosis not present

## 2020-06-29 DIAGNOSIS — J9801 Acute bronchospasm: Secondary | ICD-10-CM | POA: Diagnosis not present

## 2020-06-29 DIAGNOSIS — F17210 Nicotine dependence, cigarettes, uncomplicated: Secondary | ICD-10-CM | POA: Diagnosis not present

## 2020-06-29 DIAGNOSIS — U071 COVID-19: Secondary | ICD-10-CM | POA: Diagnosis not present

## 2020-06-29 DIAGNOSIS — Z20822 Contact with and (suspected) exposure to covid-19: Secondary | ICD-10-CM

## 2020-06-29 DIAGNOSIS — J029 Acute pharyngitis, unspecified: Secondary | ICD-10-CM | POA: Diagnosis present

## 2020-06-29 LAB — GROUP A STREP BY PCR: Group A Strep by PCR: NOT DETECTED

## 2020-06-29 LAB — SARS CORONAVIRUS 2 BY RT PCR (HOSPITAL ORDER, PERFORMED IN ~~LOC~~ HOSPITAL LAB): SARS Coronavirus 2: POSITIVE — AB

## 2020-06-29 MED ORDER — PREDNISONE 20 MG PO TABS: 20.0000 mg | ORAL_TABLET | Freq: Every day | ORAL | 0 refills | Status: AC

## 2020-06-29 MED ORDER — ALBUTEROL SULFATE HFA 108 (90 BASE) MCG/ACT IN AERS
2.0000 | INHALATION_SPRAY | Freq: Once | RESPIRATORY_TRACT | Status: AC
  Administered 2020-06-29: 2 via RESPIRATORY_TRACT
  Filled 2020-06-29: qty 6.7

## 2020-06-29 MED ORDER — ACETAMINOPHEN 500 MG PO TABS
1000.0000 mg | ORAL_TABLET | Freq: Once | ORAL | Status: AC
  Administered 2020-06-29: 1000 mg via ORAL
  Filled 2020-06-29: qty 2

## 2020-06-29 NOTE — Discharge Instructions (Signed)
Your chest x-ray today is clear were still waiting on your Covid test which may be causing some of your symptoms.  You need to quarantine at home until you get these test results back, if positive antibiotics are not helpful in treating a virus.  Use Motrin and Tylenol to help with pain and fever.  Use the albuterol inhaler provided you today as well as the prescribed steroids to help with your breathing.  Follow-up with your primary care doctor and return to the ED if you have any new or worsening symptoms.

## 2020-06-29 NOTE — ED Triage Notes (Signed)
Pt presents to ED with complaints of sore throat x 2 days, unsure of fever because she doesn't have a way to check it at home.

## 2020-06-29 NOTE — ED Provider Notes (Signed)
[facility] EMERGENCY DEPARTMENT Provider Note   CSN: [id number] Arrival date & time: 06/29/20  1236     History Chief Complaint  Patient presents with  . Sore Throat    [name] is a 44 y.o. female.  [name] is a 44 y.o. female with history of depression, hypothyroidism and tobacco abuse, who presents to the ED for evaluation of 2 days of sore throat, chills, subjective fever, cough and shortness of breath.  Patient has not had her Covid vaccine.  She is not sure of sick contacts.  She reports feeling fatigued with chills, has not had a thermometer to take her temperature.  She has had a sore throat and has felt congested with a nonproductive cough.  Today in particular she has felt short of breath but denies associated chest pain.  Continues to smoke a pack per day.  Denies nausea, vomiting, diarrhea or abdominal pains.  No loss of taste or smell.  She has not used any medications to treat her symptoms prior to arrival.  No other aggravating or alleviating factors.          Past Medical History:  Diagnosis Date  . Cyst of kidney, acquired   . Depression   . Overdose   . Suicide and self-inflicted injury (HCC)   . Thyroid disease     Patient Active Problem List   Diagnosis Date Noted  . Bipolar I disorder, most recent episode (or current) depressed, severe, without mention of psychotic behavior 01/28/2013  . Substance abuse (HCC) 01/28/2013  . Anxiety state, unspecified 01/28/2013    Past Surgical History:  Procedure Laterality Date  . ABDOMINAL HYSTERECTOMY    . CESAREAN SECTION    . OVARIAN CYST REMOVAL       OB History   No obstetric history on file.     No family history on file.  Social History   Tobacco Use  . Smoking status: Current Every Day Smoker    Packs/day: 0.50    Types: Cigarettes  . Smokeless tobacco: Never Used  Substance Use Topics  . Alcohol use: Yes  . Drug use: No    Home Medications Prior to Admission medications     Medication Sig Start Date End Date Taking? Authorizing Provider  cephALEXin (KEFLEX) 500 MG capsule Take 1 capsule (500 mg total) by mouth 4 (four) times daily. 04/15/18   Jacalyn Lefevre, MD  citalopram (CELEXA) 20 MG tablet Take 1 tablet (20 mg total) by mouth daily. For depression 02/02/13   Armandina Stammer I, NP  HYDROcodone-acetaminophen (NORCO/VICODIN) 5-325 MG tablet Take 1 tablet by mouth every 4 (four) hours as needed. 04/15/18   Jacalyn Lefevre, MD  levothyroxine (SYNTHROID, LEVOTHROID) 25 MCG tablet Take 25 mcg by mouth daily before breakfast.    [provider]  ondansetron (ZOFRAN ODT) 4 MG disintegrating tablet Take 1 tablet (4 mg total) by mouth every 8 (eight) hours as needed. 04/15/18   Jacalyn Lefevre, MD  phenazopyridine (PYRIDIUM) 200 MG tablet Take 1 tablet (200 mg total) by mouth 3 (three) times daily. 04/15/18   Jacalyn Lefevre, MD  QUEtiapine (SEROQUEL) 50 MG tablet Take 50 mg by mouth at bedtime.    [provider]    Allergies    Sulfa antibiotics  Review of Systems   Review of Systems  Constitutional: Positive for chills and fatigue. Negative for fever.  HENT: Positive for congestion, rhinorrhea and sore throat.   Respiratory: Positive for cough and shortness of breath.  Cardiovascular: Negative for chest pain.  Gastrointestinal: Negative for abdominal pain, diarrhea, nausea and vomiting.  Neurological: Negative for headaches.  All other systems reviewed and are negative.   Physical Exam Updated Vital Signs BP (!) 102/53 (BP Location: Right Arm)   Pulse 81   Temp 98.8 F (37.1 C) (Oral)   Resp 16   Ht 5\' 3"  (1.6 m)   Wt 70.3 kg   LMP 06/21/2011   SpO2 98%   BMI 27.46 kg/m   Physical Exam Vitals and nursing note reviewed.  Constitutional:      General: She is not in acute distress.    Appearance: She is well-developed. She is not ill-appearing or diaphoretic.     Comments: Well-appearing and in no distress  HENT:     Head:  Normocephalic and atraumatic.     Nose: Rhinorrhea present.     Mouth/Throat:     Mouth: Mucous membranes are moist.     Pharynx: Uvula midline. Posterior oropharyngeal erythema present. No pharyngeal swelling or oropharyngeal exudate.     Tonsils: No tonsillar abscesses.  Eyes:     General:        Right eye: No discharge.        Left eye: No discharge.  Neck:     Comments: No rigidity Cardiovascular:     Rate and Rhythm: Normal rate and regular rhythm.     Heart sounds: Normal heart sounds. No murmur heard.  No friction rub. No gallop.   Pulmonary:     Effort: Pulmonary effort is normal. No respiratory distress.     Breath sounds: Wheezing present.     Comments: Respirations equal and unlabored, patient able to speak in full sentences, lungs with expiratory wheezing noted bilaterally Abdominal:     General: Bowel sounds are normal. There is no distension.     Palpations: Abdomen is soft. There is no mass.     Tenderness: There is no abdominal tenderness. There is no guarding.     Comments: Abdomen soft, nondistended, nontender to palpation in all quadrants without guarding or peritoneal signs  Musculoskeletal:        General: No deformity.     Cervical back: Neck supple.  Lymphadenopathy:     Cervical: No cervical adenopathy.  Skin:    General: Skin is warm and dry.     Capillary Refill: Capillary refill takes less than 2 seconds.  Neurological:     Mental Status: She is alert and oriented to person, place, and time.  Psychiatric:        Mood and Affect: Mood normal.        Behavior: Behavior normal.     ED Results / Procedures / Treatments   Labs (all labs ordered are listed, but only abnormal results are displayed) Labs Reviewed  SARS CORONAVIRUS 2 BY RT PCR (HOSPITAL ORDER, PERFORMED IN [facility])    GROUP A STREP BY PCR    EKG None  Radiology DG Chest Portable 1 View  Result Date: 06/29/2020 CLINICAL DATA:  Sore throat EXAM: PORTABLE CHEST 1  VIEW COMPARISON:  01/24/2013 FINDINGS: The heart size and mediastinal contours are within normal limits. Both lungs are clear. The visualized skeletal structures are unremarkable. IMPRESSION: No active disease. Electronically Signed   By: 03/26/2013 M.D.   On: 06/29/2020 15:27     Procedures Procedures (including critical care time)  Medications Ordered in ED Medications  acetaminophen (TYLENOL) tablet 1,000 mg (1,000 mg Oral Given 06/29/20  1625)  albuterol (VENTOLIN HFA) 108 (90 Base) MCG/ACT inhaler 2 puff (2 puffs Inhalation Given 06/29/20 1626)    ED Course  I have reviewed the triage vital signs and the nursing notes.  Pertinent labs & imaging results that were available during my care of the patient were reviewed by me and considered in my medical decision making (see chart for details).    MDM Rules/Calculators/A&P                          44 year old female presents with 2 days of sore throat as well as fatigue, chills, cough and shortness of breath.  Has not been vaccinated for Covid unsure of sick contacts.  Strep test is negative and throat with mild erythema but no exudates.  Wheezing noted on lung exam with frequent cough, concern for potential Covid infection, patient is also a daily smoker, has not previously been diagnosed with COPD but this is of concern as well.  Will get Covid test and chest x-ray.  She does not currently have any signs of respiratory distress, or increased work of breathing and has not had any episodes of hypoxia.  Denies chest pain.  Chest x-ray unremarkable without evidence of pneumonia.  Covid swab is pending.  Patient's Covid test has not yet returned, but the rest of her work-up has been reassuring and she is requesting discharge home.  She has not had any episodes of hypoxia.  Wheezing initially heard on exam has improved with albuterol treatment, she is still a current smoker and while symptoms may be related to Covid patient could also easily  have undiagnosed COPD contributing to symptoms, will treat with continued use of albuterol inhaler and short burst of prednisone.  I have discussed my concern for COVID-19 infection and patient is aware that she will be called by phone if test results are positive.  She should quarantine at home until she receives the results.  Discussed symptomatic treatment at home and strict return precautions, encouraged her to purchase a home pulse ox.  If she is Covid positive she would also be a candidate for monoclonal antibody infusion which I have discussed with the patient and she is interested in.  Discharged home in good condition.   [name] was evaluated in Emergency Department on 07/01/2020 for the symptoms described in the history of present illness. She was evaluated in the context of the global COVID-19 pandemic, which necessitated consideration that the patient might be at risk for infection with the SARS-CoV-2 virus that causes COVID-19. Institutional protocols and algorithms that pertain to the evaluation of patients at risk for COVID-19 are in a state of rapid change based on information released by regulatory bodies including the CDC and federal and state organizations. These policies and algorithms were followed during the patient's care in the ED.  Final Clinical Impression(s) / ED Diagnoses Final diagnoses:  Viral URI with cough  Suspected COVID-19 virus infection  Bronchospasm    Rx / DC Orders ED Discharge Orders         Ordered    predniSONE (DELTASONE) 20 MG tablet  Daily        06/29/20 1744           Dartha Lodge, PA-C 07/01/20 1801    Bethann Berkshire, MD 07/02/20 [phone]

## 2020-06-30 ENCOUNTER — Other Ambulatory Visit: Payer: Self-pay | Admitting: Family

## 2020-06-30 ENCOUNTER — Telehealth: Payer: Self-pay | Admitting: Family

## 2020-06-30 DIAGNOSIS — U071 COVID-19: Secondary | ICD-10-CM

## 2020-06-30 DIAGNOSIS — Z6827 Body mass index (BMI) 27.0-27.9, adult: Secondary | ICD-10-CM

## 2020-06-30 DIAGNOSIS — F1910 Other psychoactive substance abuse, uncomplicated: Secondary | ICD-10-CM

## 2020-06-30 NOTE — Progress Notes (Signed)
I connected by phone with [name] on 06/30/2020 at 10:40 AM to discuss the potential use of a new treatment for mild to moderate COVID-19 viral infection in non-hospitalized patients.  This patient is a 44 y.o. female that meets the FDA criteria for Emergency Use Authorization of COVID monoclonal antibody casirivimab/imdevimab.  Has a (+) direct SARS-CoV-2 viral test result  Has mild or moderate COVID-19   Is NOT hospitalized due to COVID-19  Is within 10 days of symptom onset  Has at least one of the high risk factor(s) for progression to severe COVID-19 and/or hospitalization as defined in EUA.  Specific high risk criteria : BMI > 25   I have spoken and communicated the following to the patient or parent/caregiver regarding COVID monoclonal antibody treatment:  1. FDA has authorized the emergency use for the treatment of mild to moderate COVID-19 in adults and pediatric patients with positive results of direct SARS-CoV-2 viral testing who are 55 years of age and older weighing at least 40 kg, and who are at high risk for progressing to severe COVID-19 and/or hospitalization.  2. The significant known and potential risks and benefits of COVID monoclonal antibody, and the extent to which such potential risks and benefits are unknown.  3. Information on available alternative treatments and the risks and benefits of those alternatives, including clinical trials.  4. Patients treated with COVID monoclonal antibody should continue to self-isolate and use infection control measures (e.g., wear mask, isolate, social distance, avoid sharing personal items, clean and disinfect "high touch" surfaces, and frequent handwashing) according to CDC guidelines.   5. The patient or parent/caregiver has the option to accept or refuse COVID monoclonal antibody treatment.  After reviewing this information with the patient, The patient agreed to proceed with receiving casirivimab\imdevimab infusion and  will be provided a copy of the Fact sheet prior to receiving the infusion.   [name] 06/30/2020 10:40 AM

## 2020-06-30 NOTE — Telephone Encounter (Signed)
Called to Discuss with patient about Covid symptoms and the use of the monoclonal antibody infusion for those with mild to moderate Covid symptoms and at a high risk of hospitalization.     Pt appears to qualify for this infusion due to co-morbid conditions and/or a member of an at-risk group in accordance with the FDA Emergency Use Authorization.    Ms. Streng was seen at the [facility] ED on 9/10 with sore throat and possible fever that started on 06/27/20. She tested positive for COVID. Qualifying risk factors include BMI >25 and history of substance abuse.   Spoke with Ms. Shepheard and reviewed the risks and benefits of treatement with Regeneron and she would like to continue. She does have issues with transportation and may need assistance.   Hello Citlali AURIANA SCALIA,   You have been scheduled to receive Regeneron (the monoclonal antibody we discussed) on : 07/02/20 at 2pm   If you have been tested outside of a [facility] - you MUST bring a copy of your positive test with you the morning of your appointment. You may take a photo of this and upload to your MyChart portal or have the testing facility fax the result to [phone]    The address for the infusion clinic site is:  --GPS address is [address] - the parking is located near [employer] building where you will see  COVID19 Infusion feather banner marking the entrance to parking.   (see photos below)            --Enter into the 2nd entrance where the "wave, flag banner" is at the road. Turn into this 2nd entrance and immediately turn left to park in 1 of the 5 parking spots.   --Please stay in your car and call the desk for assistance inside [phone].   --Average time in department is roughly 2 hours for Regeneron treatment - this includes preparation of the medication, IV start and the required 1 hour monitoring after the infusion.    Should you develop worsening shortness of breath, chest pain or  severe breathing problems please do not wait for this appointment and go to the Emergency room for evaluation and treatment. You will undergo another oxygen screen before your infusion to ensure this is the best treatment option for you. There is a chance that the best decision may be to send you to the Emergency Room for evaluation at the time of your appointment.   The day of your visit you should:  Get plenty of rest the night before and drink plenty of water  Eat a light meal/snack before coming and take your medications as prescribed   Wear warm, comfortable clothes with a shirt that can roll-up over the elbow (will need IV start).   Wear a mask   Consider bringing some activity to help pass the time  Many commercial insurers are waiving bills related to COVID treatment however some have ranged from $300-640. We are starting to see some insurers send bills to patients later for the administration of the medication - we are learning more information but you may receive a bill after your appointment.  Please contact your insurance agent to discuss prior to your appointment if you would like further details about billing specific to your policy.    Marcos Eke, NP 06/30/2020 10:37 AM

## 2020-07-01 ENCOUNTER — Ambulatory Visit (HOSPITAL_COMMUNITY)
Admission: RE | Admit: 2020-07-01 | Discharge: 2020-07-01 | Disposition: A | Discharge status: Home / Self Care | Payer: Medicaid Other | Source: Ambulatory Visit | Attending: Pulmonary Disease | Admitting: Pulmonary Disease

## 2020-07-01 DIAGNOSIS — U071 COVID-19: Secondary | ICD-10-CM | POA: Insufficient documentation

## 2020-07-01 DIAGNOSIS — F1910 Other psychoactive substance abuse, uncomplicated: Secondary | ICD-10-CM | POA: Diagnosis not present

## 2020-07-01 DIAGNOSIS — Z6827 Body mass index (BMI) 27.0-27.9, adult: Secondary | ICD-10-CM | POA: Diagnosis not present

## 2020-07-01 MED ORDER — EPINEPHRINE 0.3 MG/0.3ML IJ SOAJ: 0.3000 mg | Freq: Once | INTRAMUSCULAR | Status: DC | PRN

## 2020-07-01 MED ORDER — DIPHENHYDRAMINE HCL 50 MG/ML IJ SOLN: 50.0000 mg | Freq: Once | INTRAMUSCULAR | Status: DC | PRN

## 2020-07-01 MED ORDER — SODIUM CHLORIDE 0.9 % IV SOLN
1200.0000 mg | Freq: Once | INTRAVENOUS | Status: AC
  Administered 2020-07-01: 1200 mg via INTRAVENOUS
  Filled 2020-07-01: qty 10

## 2020-07-01 MED ORDER — METHYLPREDNISOLONE SODIUM SUCC 125 MG IJ SOLR: 125.0000 mg | Freq: Once | INTRAMUSCULAR | Status: DC | PRN

## 2020-07-01 MED ORDER — SODIUM CHLORIDE 0.9 % IV SOLN: INTRAVENOUS | Status: DC | PRN

## 2020-07-01 MED ORDER — ALBUTEROL SULFATE HFA 108 (90 BASE) MCG/ACT IN AERS: 2.0000 | INHALATION_SPRAY | Freq: Once | RESPIRATORY_TRACT | Status: DC | PRN

## 2020-07-01 MED ORDER — FAMOTIDINE IN NACL 20-0.9 MG/50ML-% IV SOLN: 20.0000 mg | Freq: Once | INTRAVENOUS | Status: DC | PRN

## 2020-07-01 NOTE — Discharge Instructions (Signed)

## 2020-07-01 NOTE — Progress Notes (Signed)
  Diagnosis: COVID-19  Physician: Dr. P. Wright  Procedure: Covid Infusion Clinic Med: casirivimab\imdevimab infusion - Provided patient with casirivimab\imdevimab fact sheet for patients, parents and caregivers prior to infusion.  Complications: No immediate complications noted.  Discharge: Discharged home   [name] 07/01/2020   

## 2020-07-02 ENCOUNTER — Ambulatory Visit (HOSPITAL_COMMUNITY): Payer: Medicaid Other

## 2020-07-05 ENCOUNTER — Emergency Department (HOSPITAL_COMMUNITY)
Admission: EM | Admit: 2020-07-05 | Discharge: 2020-07-05 | Disposition: A | Discharge status: Home / Self Care | Payer: Medicaid Other | Attending: Emergency Medicine | Admitting: Emergency Medicine

## 2020-07-05 ENCOUNTER — Other Ambulatory Visit: Payer: Self-pay

## 2020-07-05 ENCOUNTER — Encounter (HOSPITAL_COMMUNITY): Payer: Self-pay | Admitting: Emergency Medicine

## 2020-07-05 ENCOUNTER — Emergency Department (HOSPITAL_COMMUNITY): Payer: Medicaid Other

## 2020-07-05 DIAGNOSIS — R05 Cough: Secondary | ICD-10-CM | POA: Diagnosis present

## 2020-07-05 DIAGNOSIS — U071 COVID-19: Secondary | ICD-10-CM | POA: Diagnosis not present

## 2020-07-05 DIAGNOSIS — F17210 Nicotine dependence, cigarettes, uncomplicated: Secondary | ICD-10-CM | POA: Diagnosis not present

## 2020-07-05 DIAGNOSIS — Z79899 Other long term (current) drug therapy: Secondary | ICD-10-CM | POA: Insufficient documentation

## 2020-07-05 DIAGNOSIS — R059 Cough, unspecified: Secondary | ICD-10-CM

## 2020-07-05 DIAGNOSIS — R000 Tachycardia, unspecified: Secondary | ICD-10-CM | POA: Diagnosis not present

## 2020-07-05 MED ORDER — SODIUM CHLORIDE 0.9 % IV BOLUS
500.0000 mL | Freq: Once | INTRAVENOUS | Status: AC
  Administered 2020-07-05: 500 mL via INTRAVENOUS

## 2020-07-05 MED ORDER — GUAIFENESIN-DM 100-10 MG/5ML PO SYRP
5.0000 mL | ORAL_SOLUTION | Freq: Once | ORAL | Status: AC
  Administered 2020-07-05: 5 mL via ORAL
  Filled 2020-07-05: qty 5

## 2020-07-05 MED ORDER — PREDNISONE 20 MG PO TABS: 20.0000 mg | ORAL_TABLET | Freq: Two times a day (BID) | ORAL | 0 refills | Status: DC

## 2020-07-05 MED ORDER — BENZONATATE 100 MG PO CAPS
200.0000 mg | ORAL_CAPSULE | Freq: Once | ORAL | Status: AC
  Administered 2020-07-05: 200 mg via ORAL
  Filled 2020-07-05: qty 2

## 2020-07-05 MED ORDER — ACETAMINOPHEN 325 MG PO TABS
650.0000 mg | ORAL_TABLET | Freq: Once | ORAL | Status: AC
  Administered 2020-07-05: 650 mg via ORAL
  Filled 2020-07-05: qty 2

## 2020-07-05 MED ORDER — ONDANSETRON HCL 4 MG/2ML IJ SOLN
4.0000 mg | Freq: Once | INTRAMUSCULAR | Status: AC
  Administered 2020-07-05: 4 mg via INTRAVENOUS
  Filled 2020-07-05: qty 2

## 2020-07-05 MED ORDER — PREDNISONE 50 MG PO TABS
60.0000 mg | ORAL_TABLET | Freq: Once | ORAL | Status: AC
  Administered 2020-07-05: 60 mg via ORAL
  Filled 2020-07-05: qty 1

## 2020-07-05 MED ORDER — BENZONATATE 200 MG PO CAPS: 200.0000 mg | ORAL_CAPSULE | Freq: Three times a day (TID) | ORAL | 0 refills | Status: DC | PRN

## 2020-07-05 NOTE — ED Provider Notes (Signed)
[facility] EMERGENCY DEPARTMENT Provider Note   CSN: [id number] Arrival date & time: 07/05/20  0310     History Chief Complaint  Patient presents with  . Cough    [name] is a 44 y.o. female.  HPI She presents for evaluation of an illness for 8 days, Covid.  She received a monoclonal antibody therapy on 07/01/2020.  She is bothered mostly by fever and cough.  She is a smoker.  She was started on albuterol inhaler, on 06/29/2020 when she was diagnosed with Covid.  She complains of ongoing nausea and vomiting with decreased oral intake.  She is taking Tylenol regularly.  She is a smoker.  She denies chest pain, weakness or dizziness.  There are no other known modifying factors.    Past Medical History:  Diagnosis Date  . Cyst of kidney, acquired   . Depression   . Overdose   . Suicide and self-inflicted injury (HCC)   . Thyroid disease     Patient Active Problem List   Diagnosis Date Noted  . Bipolar I disorder, most recent episode (or current) depressed, severe, without mention of psychotic behavior 01/28/2013  . Substance abuse (HCC) 01/28/2013  . Anxiety state, unspecified 01/28/2013    Past Surgical History:  Procedure Laterality Date  . ABDOMINAL HYSTERECTOMY    . CESAREAN SECTION    . OVARIAN CYST REMOVAL       OB History   No obstetric history on file.     No family history on file.  Social History   Tobacco Use  . Smoking status: Current Every Day Smoker    Packs/day: 0.50    Types: Cigarettes  . Smokeless tobacco: Never Used  Substance Use Topics  . Alcohol use: Yes  . Drug use: No    Home Medications Prior to Admission medications   Medication Sig Start Date End Date Taking? Authorizing Provider  levothyroxine (SYNTHROID) 75 MCG tablet Take 75 mcg by mouth daily. 06/11/20  Yes [provider]  benzonatate (TESSALON) 200 MG capsule Take 1 capsule (200 mg total) by mouth 3 (three) times daily as needed for cough. 07/05/20   Mancel Bale, MD  cephALEXin (KEFLEX) 500 MG capsule Take 1 capsule (500 mg total) by mouth 4 (four) times daily. Patient not taking: Reported on 07/05/2020 04/15/18   Jacalyn Lefevre, MD  citalopram (CELEXA) 20 MG tablet Take 1 tablet (20 mg total) by mouth daily. For depression Patient not taking: Reported on 07/05/2020 02/02/13   Armandina Stammer I, NP  HYDROcodone-acetaminophen (NORCO/VICODIN) 5-325 MG tablet Take 1 tablet by mouth every 4 (four) hours as needed. Patient not taking: Reported on 07/05/2020 04/15/18   Jacalyn Lefevre, MD  ondansetron (ZOFRAN ODT) 4 MG disintegrating tablet Take 1 tablet (4 mg total) by mouth every 8 (eight) hours as needed. Patient not taking: Reported on 07/05/2020 04/15/18   Jacalyn Lefevre, MD  phenazopyridine (PYRIDIUM) 200 MG tablet Take 1 tablet (200 mg total) by mouth 3 (three) times daily. Patient not taking: Reported on 07/05/2020 04/15/18   Jacalyn Lefevre, MD  predniSONE (DELTASONE) 20 MG tablet Take 1 tablet (20 mg total) by mouth 2 (two) times daily. 07/05/20   Mancel Bale, MD    Allergies    Sulfa antibiotics  Review of Systems   Review of Systems  All other systems reviewed and are negative.   Physical Exam Updated Vital Signs BP 100/77   Pulse 87   Temp 98.5 F (36.9 C) (Oral)  Resp (!) 21   Ht 5\' 3"  (1.6 m)   Wt 70.3 kg   LMP 06/21/2011   SpO2 91%   BMI 27.45 kg/m   Physical Exam Vitals and nursing note reviewed.  Constitutional:      General: She is not in acute distress.    Appearance: She is well-developed. She is ill-appearing. She is not toxic-appearing.  HENT:     Head: Normocephalic and atraumatic.     Right Ear: External ear normal.     Left Ear: External ear normal.  Eyes:     Conjunctiva/sclera: Conjunctivae normal.     Pupils: Pupils are equal, round, and reactive to light.  Neck:     Trachea: Phonation normal.  Cardiovascular:     Rate and Rhythm: Regular rhythm. Tachycardia present.  Pulmonary:     Effort:  Pulmonary effort is normal. No respiratory distress.     Breath sounds: No stridor.  Chest:     Chest wall: No tenderness.  Abdominal:     General: There is no distension.     Palpations: Abdomen is soft.  Musculoskeletal:        General: Normal range of motion.     Cervical back: Normal range of motion and neck supple.     Right lower leg: No edema.     Left lower leg: No edema.  Skin:    General: Skin is warm and dry.  Neurological:     Mental Status: She is alert and oriented to person, place, and time.     Cranial Nerves: No cranial nerve deficit.     Sensory: No sensory deficit.     Motor: No abnormal muscle tone.     Coordination: Coordination normal.  Psychiatric:        Mood and Affect: Mood normal.        Behavior: Behavior normal.        Thought Content: Thought content normal.        Judgment: Judgment normal.     ED Results / Procedures / Treatments   Labs (all labs ordered are listed, but only abnormal results are displayed) Labs Reviewed - No data to display  EKG None  Radiology DG Chest [facility] 1 View  Result Date: 07/05/2020 CLINICAL DATA:  Cough, fever and shortness of breath. EXAM: PORTABLE CHEST 1 VIEW COMPARISON:  06/29/2020 FINDINGS: The cardiac silhouette, mediastinal and hilar contours are within normal limits and stable. Stable appearing bronchitic type changes, likely related to smoking. No infiltrates, edema or effusions. No pulmonary lesions. The bony thorax is intact. IMPRESSION: Stable bronchitic type lung changes, likely related to smoking. No infiltrates, edema or effusions. Electronically Signed   By: 08/29/2020 M.D.   On: 07/05/2020 06:46    Procedures Procedures (including critical care time)  Medications Ordered in ED Medications  predniSONE (DELTASONE) tablet 60 mg (60 mg Oral Given 07/05/20 0957)  guaiFENesin-dextromethorphan (ROBITUSSIN DM) 100-10 MG/5ML syrup 5 mL (5 mLs Oral Given 07/05/20 0957)  benzonatate (TESSALON) capsule 200  mg (200 mg Oral Given 07/05/20 0957)  sodium chloride 0.9 % bolus 500 mL (0 mLs Intravenous Stopped 07/05/20 1030)  ondansetron (ZOFRAN) injection 4 mg (4 mg Intravenous Given 07/05/20 0958)  acetaminophen (TYLENOL) tablet 650 mg (650 mg Oral Given 07/05/20 0957)    ED Course  I have reviewed the triage vital signs and the nursing notes.  Pertinent labs & imaging results that were available during my care of the patient were reviewed by me and  considered in my medical decision making (see chart for details).    MDM Rules/Calculators/A&P                           Patient Vitals for the past 24 hrs:  BP Temp Temp src Pulse Resp SpO2 Height Weight  07/05/20 1608 100/77 98.5 F (36.9 C) Oral 87 -- 91 % -- --  07/05/20 1545 -- -- -- -- (!) 21 -- -- --  07/05/20 0830 107/62 -- -- -- -- -- -- --  07/05/20 0815 -- -- -- 89 -- 93 % -- --  07/05/20 0600 107/74 100.2 F (37.9 C) -- (!) 106 18 94 % 5\' 3"  (1.6 m) 70.3 kg    6:20 PM Reevaluation with update and discussion. After initial assessment and treatment, an updated evaluation reveals no further complaints at discharge.   Medical Decision Making:  This patient is presenting for evaluation of ongoing symptoms of COVID-19 infection, which does require a range of treatment options, and is a complaint that involves a moderate risk of morbidity and mortality. The differential diagnoses include viral illness, bronchitis, pneumonia, respiratory failure. I decided to review old records, and in summary 44 year old smoker, without previously documented chronic lung disease presenting with complications of Covid infection for 8 days..  I did not require additional historical information from anyone.   Radiologic Tests Ordered, included chest x-ray.  I independently Visualized: Radiographic images, which show no pneumonia or infiltrate   Critical Interventions-clinical evaluation, chest x-ray, medication treatment, observation and  reassessment  After These Interventions, the Patient was reevaluated and was found with ongoing symptoms of Covid infection, and likely bronchitis related to tobacco abuse.  Madalee ELANY FELIX was evaluated in Emergency Department on 07/05/2020 for the symptoms described in the history of present illness. She was evaluated in the context of the global COVID-19 pandemic, which necessitated consideration that the patient might be at risk for infection with the SARS-CoV-2 virus that causes COVID-19. Institutional protocols and algorithms that pertain to the evaluation of patients at risk for COVID-19 are in a state of rapid change based on information released by regulatory bodies including the CDC and federal and state organizations. These policies and algorithms were followed during the patient's care in the ED.   CRITICAL CARE-no Performed by: 07/07/2020  Nursing Notes Reviewed/ Care Coordinated Applicable Imaging Reviewed Interpretation of Laboratory Data incorporated into ED treatment  The patient appears reasonably screened and/or stabilized for discharge and I doubt any other medical condition or other [facility] requiring further screening, evaluation, or treatment in the ED at this time prior to discharge.  Plan: Home Medications-continue usual; Home Treatments-regular diet; return here if the recommended treatment, does not improve the symptoms; Recommended follow up-PCP follow-up as needed     Final Clinical Impression(s) / ED Diagnoses Final diagnoses:  COVID-19 virus infection    Rx / DC Orders ED Discharge Orders         Ordered    predniSONE (DELTASONE) 20 MG tablet  2 times daily        07/05/20 1603    benzonatate (TESSALON) 200 MG capsule  3 times daily PRN        07/05/20 1603           07/07/20, MD 07/05/20 1821

## 2020-07-05 NOTE — Discharge Instructions (Addendum)
Continue isolating, quarantining yourself, from other people until all of your symptoms are gone plus 3 or 4 days.  To treat your symptoms, use Tylenol for fever, Robitussin-DM for cough, and the prescriptions as given to treat for bronchitis, secondary to the Covid infection.

## 2020-07-05 NOTE — ED Triage Notes (Signed)
[facility] - pt c/o cough, fever, sore throat, and emesis. covid + on 06/29/20

## 2020-11-20 ENCOUNTER — Other Ambulatory Visit (HOSPITAL_COMMUNITY): Payer: Self-pay | Admitting: Family Medicine

## 2020-11-20 DIAGNOSIS — Z1231 Encounter for screening mammogram for malignant neoplasm of breast: Secondary | ICD-10-CM

## 2020-11-29 ENCOUNTER — Other Ambulatory Visit: Payer: Self-pay

## 2020-11-29 ENCOUNTER — Ambulatory Visit (HOSPITAL_COMMUNITY)
Admission: RE | Admit: 2020-11-29 | Discharge: 2020-11-29 | Disposition: A | Discharge status: Home / Self Care | Payer: Medicaid Other | Source: Ambulatory Visit | Attending: Family Medicine | Admitting: Family Medicine

## 2020-11-29 DIAGNOSIS — Z1231 Encounter for screening mammogram for malignant neoplasm of breast: Secondary | ICD-10-CM | POA: Insufficient documentation

## 2020-12-06 ENCOUNTER — Other Ambulatory Visit (HOSPITAL_COMMUNITY): Payer: Self-pay | Admitting: Family Medicine

## 2020-12-06 DIAGNOSIS — R928 Other abnormal and inconclusive findings on diagnostic imaging of breast: Secondary | ICD-10-CM

## 2020-12-18 ENCOUNTER — Ambulatory Visit (HOSPITAL_COMMUNITY)
Admission: RE | Admit: 2020-12-18 | Discharge: 2020-12-18 | Disposition: A | Discharge status: Home / Self Care | Payer: Medicaid Other | Source: Ambulatory Visit | Attending: Family Medicine | Admitting: Family Medicine

## 2020-12-18 ENCOUNTER — Other Ambulatory Visit: Payer: Self-pay

## 2020-12-18 DIAGNOSIS — R928 Other abnormal and inconclusive findings on diagnostic imaging of breast: Secondary | ICD-10-CM

## 2021-06-25 ENCOUNTER — Other Ambulatory Visit: Payer: Self-pay

## 2021-06-25 ENCOUNTER — Emergency Department (HOSPITAL_COMMUNITY)
Admission: EM | Admit: 2021-06-25 | Discharge: 2021-06-25 | Disposition: A | Discharge status: Home / Self Care | Payer: Medicaid Other | Attending: Emergency Medicine | Admitting: Emergency Medicine

## 2021-06-25 DIAGNOSIS — R1031 Right lower quadrant pain: Secondary | ICD-10-CM | POA: Diagnosis present

## 2021-06-25 DIAGNOSIS — L089 Local infection of the skin and subcutaneous tissue, unspecified: Secondary | ICD-10-CM | POA: Insufficient documentation

## 2021-06-25 DIAGNOSIS — F17210 Nicotine dependence, cigarettes, uncomplicated: Secondary | ICD-10-CM | POA: Insufficient documentation

## 2021-06-25 MED ORDER — DOXYCYCLINE HYCLATE 100 MG PO CAPS: 100.0000 mg | ORAL_CAPSULE | Freq: Two times a day (BID) | ORAL | 0 refills | Status: AC

## 2021-06-25 NOTE — ED Triage Notes (Signed)
PAIN in right groin area for the past 2 weeks

## 2021-06-25 NOTE — Discharge Instructions (Addendum)
You were given a prescription for antibiotics. Please take the antibiotic prescription fully.   Please follow up with your primary care provider within 5-7 days for re-evaluation of your symptoms. If you do not have a primary care provider, information for a healthcare clinic has been provided for you to make arrangements for follow up care. Please return to the emergency department for any new or worsening symptoms.  

## 2021-06-25 NOTE — ED Provider Notes (Signed)
[facility] EMERGENCY DEPARTMENT Provider Note   CSN: [id number] Arrival date & time: 06/25/21  1216     History Chief Complaint  Patient presents with   Groin Pain    [name] is a 45 y.o. female.  HPI   Pt is a 45 y/o female with history of kidney stents, depression, presents, thyroid ordered presents for evaluation of pain to the right groin.  Pain has been present for the last 2 weeks.  Initially the area was discolored.  She now has a small opening to the area and had some pus that drained yesterday.  There have been no recent fevers.  She does have pain to that leg.  Patient tearful stating that she is anxious about her symptoms due to the recent death of her fianc.  She denies ivdu  Past Medical History:  Diagnosis Date   Cyst of kidney, acquired    Depression    Overdose    Suicide and self-inflicted injury [facility])    Thyroid disease     Patient Active Problem List   Diagnosis Date Noted   Bipolar I disorder, most recent episode (or current) depressed, severe, without mention of psychotic behavior 01/28/2013   Substance abuse (HCC) 01/28/2013   Anxiety state, unspecified 01/28/2013    Past Surgical History:  Procedure Laterality Date   ABDOMINAL HYSTERECTOMY     CESAREAN SECTION     OVARIAN CYST REMOVAL       OB History   No obstetric history on file.     No family history on file.  Social History   Tobacco Use   Smoking status: Every Day    Packs/day: 0.50    Types: Cigarettes   Smokeless tobacco: Never  Substance Use Topics   Alcohol use: Yes   Drug use: No    Home Medications Prior to Admission medications   Medication Sig Start Date End Date Taking? Authorizing Provider  doxycycline (VIBRAMYCIN) 100 MG capsule Take 1 capsule (100 mg total) by mouth 2 (two) times daily for 7 days. 06/25/21 07/02/21 Yes Larren Copes S, PA-C  benzonatate (TESSALON) 200 MG capsule Take 1 capsule (200 mg total) by mouth 3 (three) times daily as needed  for cough. 07/05/20   Mancel Bale, MD  cephALEXin (KEFLEX) 500 MG capsule Take 1 capsule (500 mg total) by mouth 4 (four) times daily. Patient not taking: Reported on 07/05/2020 04/15/18   Jacalyn Lefevre, MD  citalopram (CELEXA) 20 MG tablet Take 1 tablet (20 mg total) by mouth daily. For depression Patient not taking: Reported on 07/05/2020 02/02/13   Armandina Stammer I, NP  HYDROcodone-acetaminophen (NORCO/VICODIN) 5-325 MG tablet Take 1 tablet by mouth every 4 (four) hours as needed. Patient not taking: Reported on 07/05/2020 04/15/18   Jacalyn Lefevre, MD  levothyroxine (SYNTHROID) 75 MCG tablet Take 75 mcg by mouth daily. 06/11/20   [provider]  ondansetron (ZOFRAN ODT) 4 MG disintegrating tablet Take 1 tablet (4 mg total) by mouth every 8 (eight) hours as needed. Patient not taking: Reported on 07/05/2020 04/15/18   Jacalyn Lefevre, MD  phenazopyridine (PYRIDIUM) 200 MG tablet Take 1 tablet (200 mg total) by mouth 3 (three) times daily. Patient not taking: Reported on 07/05/2020 04/15/18   Jacalyn Lefevre, MD  predniSONE (DELTASONE) 20 MG tablet Take 1 tablet (20 mg total) by mouth 2 (two) times daily. 07/05/20   Mancel Bale, MD    Allergies    Sulfa antibiotics  Review of Systems   Review  of Systems  Constitutional:  Negative for fever.  Musculoskeletal:        Right groin/leg pain  Skin:  Positive for wound.   Physical Exam Updated Vital Signs BP (!) 158/85   Pulse 99   Temp 98.2 F (36.8 C)   Resp 20   Ht 5\' 3"  (1.6 m)   Wt 71.2 kg   LMP 06/21/2011   SpO2 99%   BMI 27.81 kg/m   Physical Exam Vitals and nursing note reviewed.  Constitutional:      General: She is not in acute distress.    Appearance: She is well-developed.  HENT:     Head: Normocephalic and atraumatic.  Eyes:     Conjunctiva/sclera: Conjunctivae normal.  Cardiovascular:     Rate and Rhythm: Normal rate.  Pulmonary:     Effort: Pulmonary effort is normal.  Musculoskeletal:         General: Normal range of motion.     Cervical back: Neck supple.     Comments: 2cm area of erythema to the right inguinal area with a small central opening noted with no active drainage. No lymphangitis or significant erythema. No fluctuance, crepitus or edema to the RLE. No calf ttp. Dp pulses intact.  Skin:    General: Skin is warm and dry.  Neurological:     Mental Status: She is alert.    ED Results / Procedures / Treatments   Labs (all labs ordered are listed, but only abnormal results are displayed) Labs Reviewed - No data to display  EKG None  Radiology No results found.  Procedures Procedures   Medications Ordered in ED Medications - No data to display  ED Course  I have reviewed the triage vital signs and the nursing notes.  Pertinent labs & imaging results that were available during my care of the patient were reviewed by me and considered in my medical decision making (see chart for details).    MDM Rules/Calculators/A&P                          Patient with skin abscess that is already open and draining.  Mild cellulitis noted. Will place pt on abx. Encouraged home warm soaks and flushing.  Dc home with pcp f/u and strict return precautions. She voices understanding and is in agreement with the plan. All questions answered, pt stable for discharge    Final Clinical Impression(s) / ED Diagnoses Final diagnoses:  Skin infection    Rx / DC Orders ED Discharge Orders          Ordered    doxycycline (VIBRAMYCIN) 100 MG capsule  2 times daily        06/25/21 [address], Vernel Donlan S, PA-C 06/25/21 1415    08/25/21, MD 06/26/21 1325

## 2021-07-11 ENCOUNTER — Other Ambulatory Visit: Payer: Self-pay

## 2021-07-11 ENCOUNTER — Emergency Department (HOSPITAL_COMMUNITY)
Admission: EM | Admit: 2021-07-11 | Discharge: 2021-07-11 | Disposition: A | Discharge status: Home / Self Care | Payer: Medicaid Other | Attending: Emergency Medicine | Admitting: Emergency Medicine

## 2021-07-11 ENCOUNTER — Encounter (HOSPITAL_COMMUNITY): Payer: Self-pay | Admitting: *Deleted

## 2021-07-11 DIAGNOSIS — F17210 Nicotine dependence, cigarettes, uncomplicated: Secondary | ICD-10-CM | POA: Diagnosis not present

## 2021-07-11 DIAGNOSIS — R5383 Other fatigue: Secondary | ICD-10-CM | POA: Insufficient documentation

## 2021-07-11 LAB — TSH: TSH: 1.149 u[IU]/mL (ref 0.350–4.500)

## 2021-07-11 LAB — URINALYSIS, ROUTINE W REFLEX MICROSCOPIC
Bilirubin Urine: NEGATIVE
Glucose, UA: NEGATIVE mg/dL
Hgb urine dipstick: NEGATIVE
Ketones, ur: NEGATIVE mg/dL
Leukocytes,Ua: NEGATIVE
Nitrite: NEGATIVE
Protein, ur: NEGATIVE mg/dL
Specific Gravity, Urine: 1.013 (ref 1.005–1.030)
pH: 6 (ref 5.0–8.0)

## 2021-07-11 LAB — COMPREHENSIVE METABOLIC PANEL
ALT: 26 U/L (ref 0–44)
AST: 19 U/L (ref 15–41)
Albumin: 4.2 g/dL (ref 3.5–5.0)
Alkaline Phosphatase: 93 U/L (ref 38–126)
Anion gap: 7 (ref 5–15)
BUN: 14 mg/dL (ref 6–20)
CO2: 26 mmol/L (ref 22–32)
Calcium: 9.5 mg/dL (ref 8.9–10.3)
Chloride: 105 mmol/L (ref 98–111)
Creatinine, Ser: 0.67 mg/dL (ref 0.44–1.00)
GFR, Estimated: >60 mL/min (ref >60)
Glucose, Bld: 99 mg/dL (ref 70–99)
Potassium: 3.6 mmol/L (ref 3.5–5.1)
Sodium: 138 mmol/L (ref 135–145)
Total Bilirubin: 0.2 mg/dL — ABNORMAL LOW (ref 0.3–1.2)
Total Protein: 7.5 g/dL (ref 6.5–8.1)

## 2021-07-11 LAB — CBC
HCT: 40.1 % (ref 36.0–46.0)
Hemoglobin: 13.2 g/dL (ref 12.0–15.0)
MCH: 31.1 pg (ref 26.0–34.0)
MCHC: 32.9 g/dL (ref 30.0–36.0)
MCV: 94.4 fL (ref 80.0–100.0)
Platelets: 246 10*3/uL (ref 150–400)
RBC: 4.25 MIL/uL (ref 3.87–5.11)
RDW: 12.1 % (ref 11.5–15.5)
WBC: 9.7 10*3/uL (ref 4.0–10.5)
nRBC: 0 % (ref 0.0–0.2)

## 2021-07-11 LAB — PREGNANCY, URINE: Preg Test, Ur: NEGATIVE

## 2021-07-11 NOTE — Discharge Instructions (Addendum)
Your testing has been normal, please make sure you are drinking plenty of fluids Your blood work has been good, your thyroid is working normally, your abscess has drained, you may return to see your family doctor within 1 week as needed but return to the ER for worsening symptoms.  Tylenol or ibuprofen for groin  [facility] Doctor List    Kari Baars MD. Specialty: Pulmonary Disease Contact information: [address]  [address]  [place] [address]  [phone]   Syliva Overman, MD. Specialty: [facility] Medicine Contact information: [address]  [place] [address]  [phone]   Lilyan Punt, MD. Specialty: [facility] Medicine Contact information: [address]  [place] [address]  [phone]   Avon Gully, MD Specialty: Internal Medicine Contact information: [address] [place] [address]  [phone]   Catalina Pizza, MD. Specialty: Internal Medicine Contact information: [address]  [place] [address]  [phone]    [facility] (Dr. Selena Batten) Specialty: Family Medicine Contact information: [address]  [place] [address]  [phone]   John Giovanni, MD. Specialty: [facility] Medicine Contact information: [address]  [address]  [place] [address]  [phone]   Carylon Perches, MD. Specialty: Internal Medicine Contact information: [address]  [address]  [place] [address]  [phone]    [facility]  [address] [place], [place] [address] [phone]  Services [facility] offers a variety of basic health services.  Services include but are not limited to: Blood pressure checks  Heart rate checks  Blood sugar checks  Urine analysis  Rapid strep tests  Pregnancy tests.  Health education and referrals  People needing more complex services will be directed to a physician online.  Using these virtual visits, doctors can evaluate and prescribe medicine and treatments. There will be no medication on-site, though [facility] will help patients fill their prescriptions at little to no cost.   For More information please go to: [email or web address]

## 2021-07-11 NOTE — ED Triage Notes (Signed)
Abscess right groin area

## 2021-07-11 NOTE — ED Provider Notes (Signed)
[facility] EMERGENCY DEPARTMENT Provider Note   CSN: [id number] Arrival date & time: 07/11/21  1352     History Chief Complaint  Patient presents with   Abscess    [name] is a 45 y.o. female.   Abscess  This patient is a 45 year old female, history of thyroid disease and bipolar disorder as well as substance abuse.  She is currently on levothyroxine as well as Celexa but denies any other daily medications.  She does smoke cigarettes, denies taking any alcohol at this time.  The patient reports to me that she is currently on disability because of her bipolar disorder, she reports that she was recently treated for an abscess in the right inguinal region, review of the medical record shows that the patient was actually seen in the emergency department on 6 September during which time she was treated with antibiotic, was given a course of doxycycline which she completed several days ago.  She reports that since that time the redness has gone away there is still some discomfort and now it is open and draining some pus and blood which occurred earlier today.  She states it looks better at this time and she has not had any fevers or chills nausea vomiting or diarrhea.  Her main complaint today is that she is feeling like she has no energy and is fatigued, she thinks that she has a yeast infection after taking the doxycycline.  The symptoms have been persistent over couple of days  Past Medical History:  Diagnosis Date   Cyst of kidney, acquired    Depression    Overdose    Suicide and self-inflicted injury [facility])    Thyroid disease     Patient Active Problem List   Diagnosis Date Noted   Bipolar I disorder, most recent episode (or current) depressed, severe, without mention of psychotic behavior 01/28/2013   Substance abuse (HCC) 01/28/2013   Anxiety state, unspecified 01/28/2013    Past Surgical History:  Procedure Laterality Date   ABDOMINAL HYSTERECTOMY     CESAREAN  SECTION     OVARIAN CYST REMOVAL       OB History   No obstetric history on file.     No family history on file.  Social History   Tobacco Use   Smoking status: Every Day    Packs/day: 0.50    Types: Cigarettes   Smokeless tobacco: Never  Substance Use Topics   Alcohol use: Yes   Drug use: No    Home Medications Prior to Admission medications   Medication Sig Start Date End Date Taking? Authorizing Provider  BLACK ELDERBERRY PO Take 2 tablets by mouth daily.   Yes [provider]  Cholecalciferol (VITAMIN D-3) 125 MCG (5000 UT) TABS Take 1 tablet by mouth daily.   Yes [provider]  citalopram (CELEXA) 20 MG tablet Take 1 tablet (20 mg total) by mouth daily. For depression 02/02/13  Yes Armandina Stammer I, NP  levothyroxine (SYNTHROID) 75 MCG tablet Take 75 mcg by mouth daily. 06/11/20  Yes [provider]  benzonatate (TESSALON) 200 MG capsule Take 1 capsule (200 mg total) by mouth 3 (three) times daily as needed for cough. Patient not taking: Reported on 07/11/2021 07/05/20   Mancel Bale, MD  cephALEXin (KEFLEX) 500 MG capsule Take 1 capsule (500 mg total) by mouth 4 (four) times daily. Patient not taking: Reported on 07/05/2020 04/15/18   Jacalyn Lefevre, MD  HYDROcodone-acetaminophen (NORCO/VICODIN) 5-325 MG tablet Take 1 tablet  by mouth every 4 (four) hours as needed. Patient not taking: Reported on 07/05/2020 04/15/18   Jacalyn Lefevre, MD  ondansetron (ZOFRAN ODT) 4 MG disintegrating tablet Take 1 tablet (4 mg total) by mouth every 8 (eight) hours as needed. Patient not taking: Reported on 07/05/2020 04/15/18   Jacalyn Lefevre, MD  phenazopyridine (PYRIDIUM) 200 MG tablet Take 1 tablet (200 mg total) by mouth 3 (three) times daily. Patient not taking: Reported on 07/05/2020 04/15/18   Jacalyn Lefevre, MD  predniSONE (DELTASONE) 20 MG tablet Take 1 tablet (20 mg total) by mouth 2 (two) times daily. Patient not taking: Reported on 07/11/2021 07/05/20    Mancel Bale, MD    Allergies    Sulfa antibiotics  Review of Systems   Review of Systems  All other systems reviewed and are negative.  Physical Exam Updated Vital Signs BP (!) 96/56 (BP Location: Left Arm)   Pulse 76   Temp 98.6 F (37 C) (Oral)   Resp 18   Ht 1.6 m (5\' 3" )   Wt 70.4 kg   LMP 06/21/2011   SpO2 97%   BMI 27.48 kg/m   Physical Exam Vitals and nursing note reviewed.  Constitutional:      General: She is not in acute distress.    Appearance: She is well-developed.  HENT:     Head: Normocephalic and atraumatic.     Mouth/Throat:     Pharynx: No oropharyngeal exudate.  Eyes:     General: No scleral icterus.       Right eye: No discharge.        Left eye: No discharge.     Conjunctiva/sclera: Conjunctivae normal.     Pupils: Pupils are equal, round, and reactive to light.  Neck:     Thyroid: No thyromegaly.     Vascular: No JVD.  Cardiovascular:     Rate and Rhythm: Normal rate and regular rhythm.     Heart sounds: Normal heart sounds. No murmur heard.   No friction rub. No gallop.  Pulmonary:     Effort: Pulmonary effort is normal. No respiratory distress.     Breath sounds: Normal breath sounds. No wheezing or rales.  Abdominal:     General: Bowel sounds are normal. There is no distension.     Palpations: Abdomen is soft. There is no mass.     Tenderness: There is no abdominal tenderness.  Musculoskeletal:        General: No tenderness. Normal range of motion.     Cervical back: Normal range of motion and neck supple.  Lymphadenopathy:     Cervical: No cervical adenopathy.  Skin:    General: Skin is warm and dry.     Findings: No erythema or rash.     Comments: Right inguinal region with a 2 mm opening, there is no surrounding redness there is no drainage, there is no purulence no foul smell no induration, no swelling, no lymphadenopathy  Neurological:     Mental Status: She is alert.     Coordination: Coordination normal.  Psychiatric:         Behavior: Behavior normal.    ED Results / Procedures / Treatments   Labs (all labs ordered are listed, but only abnormal results are displayed) Labs Reviewed  COMPREHENSIVE METABOLIC PANEL - Abnormal; Notable for the following components:      Result Value   Total Bilirubin 0.2 (*)    All other components within normal limits  URINALYSIS, ROUTINE W REFLEX  MICROSCOPIC - Abnormal; Notable for the following components:   APPearance HAZY (*)    All other components within normal limits  TSH  CBC  PREGNANCY, URINE    EKG None  Radiology No results found.  Procedures Procedures   Medications Ordered in ED Medications - No data to display  ED Course  I have reviewed the triage vital signs and the nursing notes.  Pertinent labs & imaging results that were available during my care of the patient were reviewed by me and considered in my medical decision making (see chart for details).    MDM Rules/Calculators/A&P                           The patient's exam is actually unremarkable, she is hypertensive but has no fever no tachycardia no hypoxia no respiratory symptoms at all.  Her exam is very benign, the abscess seems to have completely drained and gone away.  She has finished her antibiotics, she may have a yeast infection.  The progressive fatigue is the question and at this time I will check a TSH as she has been on levothyroxine but does not have a local family doctor as her doctor has recently moved and is in process of getting in with another doctor in October.  The patient's testing has been unremarkable, overall this patient appears well, vital signs are unremarkable no tachycardia fever hypoxia or hypotension.  The patient was given these results, ambulatory without difficulty, stable for discharge  Final Clinical Impression(s) / ED Diagnoses Final diagnoses:  Fatigue, unspecified type    Rx / DC Orders ED Discharge Orders     None        Eber Hong, MD 07/11/21 [phone]

## 2021-09-16 ENCOUNTER — Ambulatory Visit: Payer: Medicaid Other | Admitting: Nurse Practitioner

## 2021-11-15 DIAGNOSIS — M064 Inflammatory polyarthropathy: Secondary | ICD-10-CM | POA: Insufficient documentation

## 2021-11-15 DIAGNOSIS — E559 Vitamin D deficiency, unspecified: Secondary | ICD-10-CM | POA: Insufficient documentation

## 2021-11-15 DIAGNOSIS — F331 Major depressive disorder, recurrent, moderate: Secondary | ICD-10-CM | POA: Insufficient documentation

## 2021-11-15 DIAGNOSIS — F17200 Nicotine dependence, unspecified, uncomplicated: Secondary | ICD-10-CM | POA: Insufficient documentation

## 2022-01-20 ENCOUNTER — Other Ambulatory Visit (HOSPITAL_COMMUNITY): Payer: Self-pay | Admitting: Nurse Practitioner

## 2022-01-20 DIAGNOSIS — N6489 Other specified disorders of breast: Secondary | ICD-10-CM

## 2022-02-11 ENCOUNTER — Ambulatory Visit (HOSPITAL_COMMUNITY): Payer: Medicaid Other

## 2022-02-11 ENCOUNTER — Encounter (HOSPITAL_COMMUNITY): Payer: Medicaid Other

## 2022-02-11 ENCOUNTER — Encounter (HOSPITAL_COMMUNITY): Payer: Self-pay

## 2022-02-18 DIAGNOSIS — E039 Hypothyroidism, unspecified: Secondary | ICD-10-CM | POA: Insufficient documentation

## 2022-02-18 DIAGNOSIS — R112 Nausea with vomiting, unspecified: Secondary | ICD-10-CM | POA: Insufficient documentation

## 2022-03-07 DIAGNOSIS — M7989 Other specified soft tissue disorders: Secondary | ICD-10-CM | POA: Insufficient documentation

## 2022-03-12 DIAGNOSIS — R1010 Upper abdominal pain, unspecified: Secondary | ICD-10-CM | POA: Insufficient documentation

## 2022-03-18 ENCOUNTER — Other Ambulatory Visit (HOSPITAL_COMMUNITY): Payer: Self-pay | Admitting: Nurse Practitioner

## 2022-03-18 ENCOUNTER — Other Ambulatory Visit (HOSPITAL_COMMUNITY): Payer: Medicaid Other

## 2022-03-18 ENCOUNTER — Inpatient Hospital Stay (HOSPITAL_COMMUNITY): Admission: RE | Admit: 2022-03-18 | Payer: Medicaid Other | Source: Ambulatory Visit

## 2022-03-18 ENCOUNTER — Ambulatory Visit (HOSPITAL_COMMUNITY): Payer: Medicaid Other

## 2022-03-18 DIAGNOSIS — N6489 Other specified disorders of breast: Secondary | ICD-10-CM

## 2022-03-19 ENCOUNTER — Other Ambulatory Visit (HOSPITAL_COMMUNITY): Payer: Self-pay | Admitting: Nurse Practitioner

## 2022-03-19 ENCOUNTER — Other Ambulatory Visit: Payer: Self-pay | Admitting: Nurse Practitioner

## 2022-03-19 DIAGNOSIS — R112 Nausea with vomiting, unspecified: Secondary | ICD-10-CM

## 2022-03-19 DIAGNOSIS — R1011 Right upper quadrant pain: Secondary | ICD-10-CM

## 2022-03-26 ENCOUNTER — Ambulatory Visit (HOSPITAL_COMMUNITY)
Admission: RE | Admit: 2022-03-26 | Discharge: 2022-03-26 | Disposition: A | Discharge status: Home / Self Care | Payer: Medicaid Other | Source: Ambulatory Visit | Attending: Nurse Practitioner | Admitting: Nurse Practitioner

## 2022-03-26 DIAGNOSIS — N6489 Other specified disorders of breast: Secondary | ICD-10-CM | POA: Diagnosis present

## 2022-03-30 ENCOUNTER — Other Ambulatory Visit: Payer: Self-pay

## 2022-03-30 ENCOUNTER — Emergency Department (HOSPITAL_COMMUNITY)
Admission: EM | Admit: 2022-03-30 | Discharge: 2022-03-30 | Disposition: A | Discharge status: Home / Self Care | Payer: Medicaid Other | Attending: Emergency Medicine | Admitting: Emergency Medicine

## 2022-03-30 ENCOUNTER — Encounter (HOSPITAL_COMMUNITY): Payer: Self-pay | Admitting: *Deleted

## 2022-03-30 DIAGNOSIS — R5383 Other fatigue: Secondary | ICD-10-CM | POA: Diagnosis not present

## 2022-03-30 DIAGNOSIS — R110 Nausea: Secondary | ICD-10-CM | POA: Diagnosis not present

## 2022-03-30 LAB — CBC
HCT: 42.3 % (ref 36.0–46.0)
Hemoglobin: 14 g/dL (ref 12.0–15.0)
MCH: 30.5 pg (ref 26.0–34.0)
MCHC: 33.1 g/dL (ref 30.0–36.0)
MCV: 92.2 fL (ref 80.0–100.0)
Platelets: 299 10*3/uL (ref 150–400)
RBC: 4.59 MIL/uL (ref 3.87–5.11)
RDW: 11.8 % (ref 11.5–15.5)
WBC: 6.6 10*3/uL (ref 4.0–10.5)
nRBC: 0 % (ref 0.0–0.2)

## 2022-03-30 LAB — URINALYSIS, ROUTINE W REFLEX MICROSCOPIC
Bilirubin Urine: NEGATIVE
Glucose, UA: NEGATIVE mg/dL
Hgb urine dipstick: NEGATIVE
Ketones, ur: NEGATIVE mg/dL
Leukocytes,Ua: NEGATIVE
Nitrite: NEGATIVE
Protein, ur: NEGATIVE mg/dL
Specific Gravity, Urine: 1.016 (ref 1.005–1.030)
pH: 6 (ref 5.0–8.0)

## 2022-03-30 LAB — COMPREHENSIVE METABOLIC PANEL
ALT: 18 U/L (ref 0–44)
AST: 17 U/L (ref 15–41)
Albumin: 3.6 g/dL (ref 3.5–5.0)
Alkaline Phosphatase: 104 U/L (ref 38–126)
Anion gap: 4 — ABNORMAL LOW (ref 5–15)
BUN: 8 mg/dL (ref 6–20)
CO2: 27 mmol/L (ref 22–32)
Calcium: 9 mg/dL (ref 8.9–10.3)
Chloride: 105 mmol/L (ref 98–111)
Creatinine, Ser: 0.77 mg/dL (ref 0.44–1.00)
GFR, Estimated: >60 mL/min (ref >60)
Glucose, Bld: 96 mg/dL (ref 70–99)
Potassium: 3.6 mmol/L (ref 3.5–5.1)
Sodium: 136 mmol/L (ref 135–145)
Total Bilirubin: 0.4 mg/dL (ref 0.3–1.2)
Total Protein: 7.1 g/dL (ref 6.5–8.1)

## 2022-03-30 LAB — LIPASE, BLOOD: Lipase: 19 U/L (ref 11–51)

## 2022-03-30 LAB — TSH: TSH: 3.002 u[IU]/mL (ref 0.350–4.500)

## 2022-03-30 MED ORDER — ONDANSETRON HCL 4 MG PO TABS: 4.0000 mg | ORAL_TABLET | Freq: Four times a day (QID) | ORAL | 0 refills | Status: AC | PRN

## 2022-03-30 NOTE — ED Triage Notes (Addendum)
Pt reports on antibiotics for a tick bite recently.  Pt has finished antibiotics.  Pt c/o fatigue and break out in sweats. + emesis. Pt noted drinking a soda during triage.

## 2022-03-30 NOTE — Discharge Instructions (Signed)
I did not see anything abnormal in your work-up or your vital signs today.  It is unclear what is causing your fatigue and nausea, I do recommend that you have further evaluation with your primary care provider.  I am prescribing you some nausea medication in the meantime.  You are still pending your thyroid levels today, I recommend following up based on the results with your primary care provider, if this level is low that may explain some of the fatigue that you are experiencing.

## 2022-03-30 NOTE — ED Provider Notes (Signed)
[facility] EMERGENCY DEPARTMENT Provider Note   CSN: [id number] Arrival date & time: 03/30/22  1151     History  Chief Complaint  Patient presents with   Fatigue    [name] is a 46 y.o. female with PMH significant for depression, bipolar disorder, thyroid disease, tobacco abuse who presents with concern for generalized fatigue, persistent nausea for around 2 months. Patient reports axillary insect bite that she is suspicious may have been a tick around 2 months ago.  She reports that it had become infected and had purulent drainage, but she was treated with doxycycline as well as amoxicillin and has had full resolution of any redness or purulent drainage of the left axilla.  She denies any disseminated petechial rash or targetoid lesion.  She did not receive any blood work testing for tickborne illness titers.  Patient denies abdominal pain, dysuria, hematuria, vaginal bleeding, discharge.  No previous history of cancer, tuberculosis.  HPI     Home Medications Prior to Admission medications   Medication Sig Start Date End Date Taking? Authorizing Provider  ondansetron (ZOFRAN) 4 MG tablet Take 1 tablet (4 mg total) by mouth every 6 (six) hours as needed for nausea or vomiting. 03/30/22  Yes Jupiter Boys H, PA-C  benzonatate (TESSALON) 200 MG capsule Take 1 capsule (200 mg total) by mouth 3 (three) times daily as needed for cough. Patient not taking: Reported on 07/11/2021 07/05/20   Mancel Bale, MD  BLACK ELDERBERRY PO Take 2 tablets by mouth daily.    [provider]  cephALEXin (KEFLEX) 500 MG capsule Take 1 capsule (500 mg total) by mouth 4 (four) times daily. Patient not taking: Reported on 07/05/2020 04/15/18   Jacalyn Lefevre, MD  Cholecalciferol (VITAMIN D-3) 125 MCG (5000 UT) TABS Take 1 tablet by mouth daily.    [provider]  citalopram (CELEXA) 20 MG tablet Take 1 tablet (20 mg total) by mouth daily. For depression 02/02/13   Armandina Stammer I,  NP  HYDROcodone-acetaminophen (NORCO/VICODIN) 5-325 MG tablet Take 1 tablet by mouth every 4 (four) hours as needed. Patient not taking: Reported on 07/05/2020 04/15/18   Jacalyn Lefevre, MD  levothyroxine (SYNTHROID) 75 MCG tablet Take 75 mcg by mouth daily. 06/11/20   [provider]  ondansetron (ZOFRAN ODT) 4 MG disintegrating tablet Take 1 tablet (4 mg total) by mouth every 8 (eight) hours as needed. Patient not taking: Reported on 07/05/2020 04/15/18   Jacalyn Lefevre, MD  phenazopyridine (PYRIDIUM) 200 MG tablet Take 1 tablet (200 mg total) by mouth 3 (three) times daily. Patient not taking: Reported on 07/05/2020 04/15/18   Jacalyn Lefevre, MD  predniSONE (DELTASONE) 20 MG tablet Take 1 tablet (20 mg total) by mouth 2 (two) times daily. Patient not taking: Reported on 07/11/2021 07/05/20   Mancel Bale, MD      Allergies    Sulfa antibiotics    Review of Systems   Review of Systems  Constitutional:  Positive for fatigue.  All other systems reviewed and are negative.   Physical Exam Updated Vital Signs BP 110/78 (BP Location: Left Arm)   Pulse (!) 57   Temp 97.9 F (36.6 C) (Oral)   Resp 20   Ht 5\' 3"  (1.6 m)   LMP 06/21/2011   SpO2 98%   BMI 27.48 kg/m  Physical Exam Vitals and nursing note reviewed.  Constitutional:      General: She is not in acute distress.    Appearance: Normal appearance.  HENT:  Head: Normocephalic and atraumatic.  Eyes:     General:        Right eye: No discharge.        Left eye: No discharge.  Cardiovascular:     Rate and Rhythm: Normal rate and regular rhythm.     Heart sounds: No murmur heard.    No friction rub. No gallop.  Pulmonary:     Effort: Pulmonary effort is normal.     Breath sounds: Normal breath sounds.  Abdominal:     General: Bowel sounds are normal.     Palpations: Abdomen is soft.  Skin:    General: Skin is warm and dry.     Capillary Refill: Capillary refill takes less than 2 seconds.  Neurological:      Mental Status: She is alert and oriented to person, place, and time.  Psychiatric:        Mood and Affect: Mood normal.        Behavior: Behavior normal.     ED Results / Procedures / Treatments   Labs (all labs ordered are listed, but only abnormal results are displayed) Labs Reviewed  COMPREHENSIVE METABOLIC PANEL - Abnormal; Notable for the following components:      Result Value   Anion gap 4 (*)    All other components within normal limits  CBC  URINALYSIS, ROUTINE W REFLEX MICROSCOPIC  LIPASE, BLOOD  TSH    EKG None  Radiology No results found.  Procedures Procedures    Medications Ordered in ED Medications - No data to display  ED Course/ Medical Decision Making/ A&P                           Medical Decision Making Amount and/or Complexity of Data Reviewed Labs: ordered.  Risk Prescription drug management.   This patient is a 45 y.o. female who presents to the ED for concern of generalized fatigue, nausea without abdominal pain, vomiting, diarrhea, fever, chills, this involves an extensive number of treatment options, and is a complaint that carries with it a high risk of complications and morbidity. The emergent differential diagnosis prior to evaluation includes, but is not limited to, electrolyte abnormality, gallbladder issue, stomach ulcer, thyroid abnormality, anemia, depression, versus other.  Patient also with somewhat recent tick bite evidence of some secondary infection based on her description he was placed on Doxy and amoxicillin, although this lesion seems fully resolved, patient with no systemic characteristics suggestive of Lyme or RMSF.   This is not an exhaustive differential.   Past Medical History / Co-morbidities / Social History:  depression, bipolar disorder, thyroid disease, tobacco abuse  Additional history: Chart reviewed. Pertinent results include: Recent emergency department visits, lab work, imaging  Physical Exam: Physical  exam performed. The pertinent findings include: Overall reassuring physical exam.  Patient with no tenderness palpation of the abdomen, no accessory lung sounds.  Lab Tests: I ordered, and personally interpreted labs.  The pertinent results include: Unremarkable CBC, CMP, lipase, TSH, urinalysis.   Cardiac Monitoring:  The patient was maintained on a cardiac monitor.  My attending physician Dr. Wilkie Aye viewed and interpreted the cardiac monitored which showed an underlying rhythm of: NSR. I agree with this interpretation.   Disposition: After consideration of the diagnostic results and the patients response to treatment, I feel that it is unclear what is causing patient's generalized fatigue, and occasional nausea.  I do not see any worrisome findings on her work-up today.  She is not having any pain or infectious symptoms.  Her vital signs are stable.  Discussed I have low suspicion for long-term manifestation of Lyme or RMSF disease, her TSH was pending at time of discharge but on evaluation since discharge TSH is normal.  Discussed patient should have her vitamin D levels assessed, and follow-up with her primary care for further evaluation.  Encouraged to return to the emergency department if symptoms worsen.  Patient discharged in stable condition at this time..   I discussed this case with my attending physician Dr. Wilkie AyeHorton who cosigned this note including patient's presenting symptoms, physical exam, and planned diagnostics and interventions. Attending physician stated agreement with plan or made changes to plan which were implemented.    Final Clinical Impression(s) / ED Diagnoses Final diagnoses:  Other fatigue  Nausea    Rx / DC Orders ED Discharge Orders          Ordered    ondansetron (ZOFRAN) 4 MG tablet  Every 6 hours PRN        03/30/22 1510              Mija Effertz, Minothristian H, PA-C 03/30/22 1636    Rozelle LoganHorton, Kristie M, DO 03/31/22 [phone]

## 2022-04-07 ENCOUNTER — Encounter (HOSPITAL_COMMUNITY): Payer: Self-pay

## 2022-04-07 ENCOUNTER — Ambulatory Visit (HOSPITAL_COMMUNITY): Admission: RE | Admit: 2022-04-07 | Payer: Medicaid Other | Source: Ambulatory Visit

## 2022-07-29 DIAGNOSIS — F411 Generalized anxiety disorder: Secondary | ICD-10-CM | POA: Insufficient documentation

## 2022-07-29 DIAGNOSIS — F1290 Cannabis use, unspecified, uncomplicated: Secondary | ICD-10-CM | POA: Insufficient documentation

## 2022-08-27 ENCOUNTER — Other Ambulatory Visit (HOSPITAL_COMMUNITY): Payer: Self-pay | Admitting: Adult Health

## 2022-08-27 ENCOUNTER — Ambulatory Visit (HOSPITAL_COMMUNITY)
Admission: RE | Admit: 2022-08-27 | Discharge: 2022-08-27 | Disposition: A | Discharge status: Home / Self Care | Payer: Medicaid Other | Source: Ambulatory Visit | Attending: Adult Health | Admitting: Adult Health

## 2022-08-27 DIAGNOSIS — R058 Other specified cough: Secondary | ICD-10-CM

## 2022-09-05 ENCOUNTER — Other Ambulatory Visit (HOSPITAL_COMMUNITY): Payer: Self-pay | Admitting: Radiology

## 2022-09-05 DIAGNOSIS — R0602 Shortness of breath: Secondary | ICD-10-CM

## 2022-09-29 ENCOUNTER — Other Ambulatory Visit (HOSPITAL_COMMUNITY)
Admission: RE | Admit: 2022-09-29 | Discharge: 2022-09-29 | Disposition: A | Discharge status: Home / Self Care | Payer: Medicaid Other | Source: Ambulatory Visit | Attending: Family Medicine | Admitting: Family Medicine

## 2022-09-29 DIAGNOSIS — E7849 Other hyperlipidemia: Secondary | ICD-10-CM | POA: Insufficient documentation

## 2022-09-29 LAB — BASIC METABOLIC PANEL
Anion gap: 5 (ref 5–15)
BUN: 6 mg/dL (ref 6–20)
CO2: 26 mmol/L (ref 22–32)
Calcium: 8.8 mg/dL — ABNORMAL LOW (ref 8.9–10.3)
Chloride: 106 mmol/L (ref 98–111)
Creatinine, Ser: 0.69 mg/dL (ref 0.44–1.00)
GFR, Estimated: >60 mL/min (ref >60)
Glucose, Bld: 84 mg/dL (ref 70–99)
Potassium: 3.6 mmol/L (ref 3.5–5.1)
Sodium: 137 mmol/L (ref 135–145)

## 2022-09-29 LAB — LIPID PANEL
Cholesterol: 177 mg/dL (ref 0–200)
HDL: 36 mg/dL — ABNORMAL LOW (ref >40)
LDL Cholesterol: 110 mg/dL — ABNORMAL HIGH (ref 0–99)
Total CHOL/HDL Ratio: 4.9 RATIO
Triglycerides: 157 mg/dL — ABNORMAL HIGH (ref <150)
VLDL: 31 mg/dL (ref 0–40)

## 2022-09-29 LAB — TSH: TSH: 4.164 u[IU]/mL (ref 0.350–4.500)

## 2022-10-02 DIAGNOSIS — F17210 Nicotine dependence, cigarettes, uncomplicated: Secondary | ICD-10-CM | POA: Insufficient documentation

## 2023-03-04 DIAGNOSIS — E785 Hyperlipidemia, unspecified: Secondary | ICD-10-CM | POA: Insufficient documentation

## 2023-03-04 DIAGNOSIS — J449 Chronic obstructive pulmonary disease, unspecified: Secondary | ICD-10-CM | POA: Insufficient documentation

## 2023-03-04 DIAGNOSIS — M6281 Muscle weakness (generalized): Secondary | ICD-10-CM | POA: Insufficient documentation

## 2024-02-05 ENCOUNTER — Encounter (HOSPITAL_COMMUNITY): Payer: Self-pay

## 2024-02-05 ENCOUNTER — Emergency Department (HOSPITAL_COMMUNITY): Payer: MEDICAID

## 2024-02-05 ENCOUNTER — Emergency Department (HOSPITAL_COMMUNITY)
Admission: EM | Admit: 2024-02-05 | Discharge: 2024-02-05 | Disposition: A | Discharge status: Home / Self Care | Payer: MEDICAID | Attending: Emergency Medicine | Admitting: Emergency Medicine

## 2024-02-05 ENCOUNTER — Other Ambulatory Visit: Payer: Self-pay

## 2024-02-05 DIAGNOSIS — T7840XA Allergy, unspecified, initial encounter: Secondary | ICD-10-CM | POA: Diagnosis present

## 2024-02-05 DIAGNOSIS — D72829 Elevated white blood cell count, unspecified: Secondary | ICD-10-CM | POA: Insufficient documentation

## 2024-02-05 DIAGNOSIS — E876 Hypokalemia: Secondary | ICD-10-CM | POA: Diagnosis not present

## 2024-02-05 LAB — CBC WITH DIFFERENTIAL/PLATELET
Abs Immature Granulocytes: 0.07 10*3/uL (ref 0.00–0.07)
Basophils Absolute: 0.1 10*3/uL (ref 0.0–0.1)
Basophils Relative: 0 %
Eosinophils Absolute: 0.1 10*3/uL (ref 0.0–0.5)
Eosinophils Relative: 1 %
HCT: 37.6 % (ref 36.0–46.0)
Hemoglobin: 12.8 g/dL (ref 12.0–15.0)
Immature Granulocytes: 1 %
Lymphocytes Relative: 19 %
Lymphs Abs: 3 10*3/uL (ref 0.7–4.0)
MCH: 31.5 pg (ref 26.0–34.0)
MCHC: 34 g/dL (ref 30.0–36.0)
MCV: 92.6 fL (ref 80.0–100.0)
Monocytes Absolute: 1.4 10*3/uL — ABNORMAL HIGH (ref 0.1–1.0)
Monocytes Relative: 9 %
Neutro Abs: 10.6 10*3/uL — ABNORMAL HIGH (ref 1.7–7.7)
Neutrophils Relative %: 70 %
Platelets: 264 10*3/uL (ref 150–400)
RBC: 4.06 MIL/uL (ref 3.87–5.11)
RDW: 12.5 % (ref 11.5–15.5)
WBC: 15.3 10*3/uL — ABNORMAL HIGH (ref 4.0–10.5)
nRBC: 0 % (ref 0.0–0.2)

## 2024-02-05 LAB — BASIC METABOLIC PANEL WITH GFR
Anion gap: 10 (ref 5–15)
BUN: 9 mg/dL (ref 6–20)
CO2: 25 mmol/L (ref 22–32)
Calcium: 8.9 mg/dL (ref 8.9–10.3)
Chloride: 103 mmol/L (ref 98–111)
Creatinine, Ser: 0.87 mg/dL (ref 0.44–1.00)
GFR, Estimated: >60 mL/min (ref >60)
Glucose, Bld: 125 mg/dL — ABNORMAL HIGH (ref 70–99)
Potassium: 3.2 mmol/L — ABNORMAL LOW (ref 3.5–5.1)
Sodium: 138 mmol/L (ref 135–145)

## 2024-02-05 LAB — RAPID URINE DRUG SCREEN, HOSP PERFORMED
Amphetamines: NOT DETECTED
Barbiturates: NOT DETECTED
Benzodiazepines: NOT DETECTED
Cocaine: NOT DETECTED
Opiates: NOT DETECTED
Tetrahydrocannabinol: POSITIVE — AB

## 2024-02-05 LAB — URINALYSIS, ROUTINE W REFLEX MICROSCOPIC
Bilirubin Urine: NEGATIVE
Glucose, UA: NEGATIVE mg/dL
Hgb urine dipstick: NEGATIVE
Ketones, ur: NEGATIVE mg/dL
Leukocytes,Ua: NEGATIVE
Nitrite: NEGATIVE
Protein, ur: NEGATIVE mg/dL
Specific Gravity, Urine: 1.004 — ABNORMAL LOW (ref 1.005–1.030)
pH: 6 (ref 5.0–8.0)

## 2024-02-05 LAB — PREGNANCY, URINE: Preg Test, Ur: NEGATIVE

## 2024-02-05 LAB — MAGNESIUM: Magnesium: 1.7 mg/dL (ref 1.7–2.4)

## 2024-02-05 MED ORDER — SODIUM CHLORIDE 0.9 % IV BOLUS
1000.0000 mL | Freq: Once | INTRAVENOUS | Status: AC
  Administered 2024-02-05: 1000 mL via INTRAVENOUS

## 2024-02-05 MED ORDER — EPINEPHRINE 0.3 MG/0.3ML IJ SOAJ: 0.3000 mg | INTRAMUSCULAR | 0 refills | Status: AC | PRN

## 2024-02-05 MED ORDER — FAMOTIDINE IN NACL 20-0.9 MG/50ML-% IV SOLN
20.0000 mg | Freq: Once | INTRAVENOUS | Status: AC
  Administered 2024-02-05: 20 mg via INTRAVENOUS
  Filled 2024-02-05: qty 50

## 2024-02-05 MED ORDER — CETIRIZINE HCL 10 MG PO TABS: 10.0000 mg | ORAL_TABLET | Freq: Every day | ORAL | 0 refills | Status: AC

## 2024-02-05 MED ORDER — POTASSIUM CHLORIDE CRYS ER 20 MEQ PO TBCR
40.0000 meq | EXTENDED_RELEASE_TABLET | Freq: Once | ORAL | Status: AC
  Administered 2024-02-05: 40 meq via ORAL
  Filled 2024-02-05: qty 2

## 2024-02-05 MED ORDER — PREDNISONE 10 MG (21) PO TBPK: ORAL_TABLET | Freq: Every day | ORAL | 0 refills | Status: DC

## 2024-02-05 MED ORDER — DEXAMETHASONE 4 MG PO TABS
8.0000 mg | ORAL_TABLET | Freq: Once | ORAL | Status: AC
  Administered 2024-02-05: 8 mg via ORAL
  Filled 2024-02-05: qty 2

## 2024-02-05 NOTE — Discharge Instructions (Addendum)
 Please follow-up with your primary doctor(s) within 2-3 days. Please avoid any known triggers of your allergies.  We have given you a prescription for an Epi-Pen. Please pick it up as soon as possible. Always carry this with you. Only use the Epi-Pen in the event of a severe allergic reaction with trouble breathing or throat swelling. You must go to the hospital right away if you ever use the Epi-Pen. Remember that they expire every year so you should have your doctor write a new prescription yearly. We have also given you a prescription for prednisone and an antihistamine. Please pick it up as soon as possible and use as directed.    Please return to the Emergency Department right away if you have any worsening or new shortness of breath, changes in your voice, tightness/itching in your mouth/throat, swelling, severe hives, chest pain, high fever. There is a very small chance of a recurrence of the allergic reaction, typically in the next 24 hours. If you see the same symptoms (rash, trouble breathing, vomiting, etc) return, come back to the Emergency Department immediately.   Please return to the emergency department immediately for any new or concerning symptoms, or if you get worse.

## 2024-02-05 NOTE — ED Notes (Signed)
 X-ray at bedside

## 2024-02-05 NOTE — ED Triage Notes (Signed)
 Pt to ED after calling 911 due to suspected allergic reaction; endorses shortness of breath, n/v. Known allergies to sulfa antibiotics. Started amoxicillin  today after being diagnosed with a sinus infection. Took a pill about 4 hours ago, and symptoms began about 30 minutes later. EMS admin  0.5 mg epi, 4 mg zofran , 50 mg benadryl , 500 cc normal saline. Symptom relief after meds given.

## 2024-02-05 NOTE — ED Provider Notes (Signed)
 [place] EMERGENCY DEPARTMENT AT [facility] Provider Note  CSN: [id number] Arrival date & time: 02/05/24 2024  Chief Complaint(s) Allergic Reaction  HPI [name] is a 48 y.o. female with past medical history as below, significant for depression, bipolar 1 disorder, polysubstance abuse who presents to the ED with complaint of possible allergic reaction, anaphylaxis  Patient reports she was started on amoxicillin , she took the first dose this evening, shortly after this she took a nap.  Woke up with difficulty breathing, nausea, felt she had a rash to her chest and felt her skin was burning.  She vomited multiple times.  Felt like she had significant difficulty breathing, EMS was dispatched, on arrival there was concern for possible anaphylaxis.  She was given Benadryl , steroids, epinephrine , IV fluids with precipitous improvement to her symptoms.  On arrival she is feeling significantly better.  Breathing has greatly improved.  She feels the rash has resolved.  Past Medical History Past Medical History:  Diagnosis Date   Cyst of kidney, acquired    Depression    Overdose    Suicide and self-inflicted injury [facility])    Thyroid  disease    Patient Active Problem List   Diagnosis Date Noted   Severe bipolar I disorder, current or most recent episode depressed (HCC) 01/28/2013   Substance abuse (HCC) 01/28/2013   Anxiety state 01/28/2013   Home Medication(s) Prior to Admission medications   Medication Sig Start Date End Date Taking? Authorizing Provider  cetirizine  (ZYRTEC  ALLERGY) 10 MG tablet Take 1 tablet (10 mg total) by mouth daily for 14 days. 02/05/24 02/19/24 Yes Russella Courts A, DO  EPINEPHrine  0.3 mg/0.3 mL IJ SOAJ injection Inject 0.3 mg into the muscle as needed for anaphylaxis. 02/05/24  Yes Teddi Favors, DO  predniSONE  (STERAPRED UNI-PAK 21 TAB) 10 MG (21) TBPK tablet Take by mouth daily. Take 6 tabs by mouth daily  for 2 days, then 5 tabs for 2 days, then 4  tabs for 2 days, then 3 tabs for 2 days, 2 tabs for 2 days, then 1 tab by mouth daily for 2 days 02/05/24  Yes Russella Courts A, DO  benzonatate  (TESSALON ) 200 MG capsule Take 1 capsule (200 mg total) by mouth 3 (three) times daily as needed for cough. Patient not taking: Reported on 07/11/2021 07/05/20   Carlton Chick, MD  BLACK ELDERBERRY PO Take 2 tablets by mouth daily.    [provider]  cephALEXin  (KEFLEX ) 500 MG capsule Take 1 capsule (500 mg total) by mouth 4 (four) times daily. Patient not taking: Reported on 07/05/2020 04/15/18   Sueellen Emery, MD  Cholecalciferol (VITAMIN D-3) 125 MCG (5000 UT) TABS Take 1 tablet by mouth daily.    [provider]  citalopram  (CELEXA ) 20 MG tablet Take 1 tablet (20 mg total) by mouth daily. For depression 02/02/13   Asuncion Layer I, NP  HYDROcodone -acetaminophen  (NORCO/VICODIN) 5-325 MG tablet Take 1 tablet by mouth every 4 (four) hours as needed. Patient not taking: Reported on 07/05/2020 04/15/18   Haviland, Julie, MD  levothyroxine (SYNTHROID) 75 MCG tablet Take 75 mcg by mouth daily. 06/11/20   [provider]  ondansetron  (ZOFRAN  ODT) 4 MG disintegrating tablet Take 1 tablet (4 mg total) by mouth every 8 (eight) hours as needed. Patient not taking: Reported on 07/05/2020 04/15/18   Sueellen Emery, MD  ondansetron  (ZOFRAN ) 4 MG tablet Take 1 tablet (4 mg total) by mouth every 6 (six) hours as needed for nausea or  vomiting. 03/30/22   Prosperi, Christian H, PA-C  phenazopyridine  (PYRIDIUM ) 200 MG tablet Take 1 tablet (200 mg total) by mouth 3 (three) times daily. Patient not taking: Reported on 07/05/2020 04/15/18   Sueellen Emery, MD                                                                                                                                    Past Surgical History Past Surgical History:  Procedure Laterality Date   ABDOMINAL HYSTERECTOMY     CESAREAN SECTION     OVARIAN CYST REMOVAL     Family  History History reviewed. No pertinent family history.  Social History Social History   Tobacco Use   Smoking status: Every Day    Current packs/day: 0.50    Types: Cigarettes   Smokeless tobacco: Never  Substance Use Topics   Alcohol use: Yes   Drug use: No   Allergies Penicillins and Sulfa antibiotics  Review of Systems A thorough review of systems was obtained and all systems are negative except as noted in the HPI and PMH.   Physical Exam Vital Signs  I have reviewed the triage vital signs BP 103/61   Pulse 93   Temp 98.1 F (36.7 C) (Oral)   Resp 18   Ht 5\' 3"  (1.6 m)   Wt 70.3 kg   LMP 06/21/2011   SpO2 95%   BMI 27.46 kg/m  Physical Exam Vitals and nursing note reviewed.  Constitutional:      General: She is not in acute distress.    Appearance: Normal appearance.  HENT:     Head: Normocephalic and atraumatic.     Right Ear: External ear normal.     Left Ear: External ear normal.     Nose: Nose normal.     Mouth/Throat:     Mouth: Mucous membranes are moist.  Eyes:     General: No scleral icterus.       Right eye: No discharge.        Left eye: No discharge.  Cardiovascular:     Rate and Rhythm: Regular rhythm. Tachycardia present.     Pulses: Normal pulses.     Heart sounds: Normal heart sounds.  Pulmonary:     Effort: Pulmonary effort is normal. Tachypnea present. No respiratory distress.     Breath sounds: Normal breath sounds. No stridor.  Abdominal:     General: Abdomen is flat. There is no distension.     Palpations: Abdomen is soft.     Tenderness: There is no abdominal tenderness.  Musculoskeletal:     Cervical back: No rigidity.     Right lower leg: No edema.     Left lower leg: No edema.  Skin:    General: Skin is warm and dry.     Capillary Refill: Capillary refill takes less than 2 seconds.  Neurological:     Mental Status: She is  alert.     Motor: Tremor present.  Psychiatric:        Mood and Affect: Mood normal.         Behavior: Behavior normal. Behavior is cooperative.     ED Results and Treatments Labs (all labs ordered are listed, but only abnormal results are displayed) Labs Reviewed  CBC WITH DIFFERENTIAL/PLATELET - Abnormal; Notable for the following components:      Result Value   WBC 15.3 (*)    Neutro Abs 10.6 (*)    Monocytes Absolute 1.4 (*)    All other components within normal limits  BASIC METABOLIC PANEL WITH GFR - Abnormal; Notable for the following components:   Potassium 3.2 (*)    Glucose, Bld 125 (*)    All other components within normal limits  URINALYSIS, ROUTINE W REFLEX MICROSCOPIC - Abnormal; Notable for the following components:   Color, Urine STRAW (*)    Specific Gravity, Urine 1.004 (*)    All other components within normal limits  RAPID URINE DRUG SCREEN, HOSP PERFORMED - Abnormal; Notable for the following components:   Tetrahydrocannabinol POSITIVE (*)    All other components within normal limits  MAGNESIUM   PREGNANCY, URINE                                                                                                                          Radiology DG Chest Portable 1 View Result Date: 02/05/2024 CLINICAL DATA:  Shortness of breath with suspected allergic reaction. EXAM: PORTABLE CHEST 1 VIEW COMPARISON:  August 27, 2022 FINDINGS: The heart size and mediastinal contours are within normal limits. Both lungs are clear. The visualized skeletal structures are unremarkable. IMPRESSION: No active disease. Electronically Signed   By: Virgle Grime M.D.   On: 02/05/2024 23:07    Pertinent labs & imaging results that were available during my care of the patient were reviewed by me and considered in my medical decision making (see MDM for details).  Medications Ordered in ED Medications  sodium chloride  0.9 % bolus 1,000 mL (0 mLs Intravenous Stopped 02/05/24 2146)  famotidine  (PEPCID ) IVPB 20 mg premix (0 mg Intravenous Stopped 02/05/24 2146)  potassium  chloride SA (KLOR-CON  M) CR tablet 40 mEq (40 mEq Oral Given 02/05/24 2310)  dexamethasone  (DECADRON ) tablet 8 mg (8 mg Oral Given 02/05/24 2310)  Procedures Procedures  (including critical care time)  Medical Decision Making / ED Course    Medical Decision Making:    Faviola Melven Stable Pendry is a 48 y.o. female with past medical history as below, significant for depression, bipolar 1 disorder, polysubstance abuse who presents to the ED with complaint of possible allergic reaction, anaphylaxis. The complaint involves an extensive differential diagnosis and also carries with it a high risk of complications and morbidity.  Serious etiology was considered. Ddx includes but is not limited to: Anaphylaxis, electrolyte derangement, dehydration, pneumonia, intoxication, etc.  Complete initial physical exam performed, notably the patient was in no distress, tachypnea noted but no hypoxia.    Reviewed and confirmed nursing documentation for past medical history, family history, social history.  Vital signs reviewed.      Clinical Course as of 02/05/24 2312  Fri Feb 05, 2024  2254 WBC(!): 15.3 Likely stress response 2/2 epi/?anaphylaxis  [SG]  2303 Symptoms resolved, pt asking for discharge. She does not want to wait for her chest xray to result. She will follow up with this on mychart. Advised f/u with pcp next week  [SG]  2305 Tetrahydrocannabinol(!): POSITIVE [SG]  2306 Potassium(!): 3.2 replace [SG]    Clinical Course User Index [SG] Teddi Favors, DO    Brief summary: 48 year old female here with concern for possible allergic reaction, anaphylaxis.  Symptoms improve after interventions by EMS.  She is feeling much better on arrival.  Labs reviewed as above.  Given further fluids, Pepcid , Decadron , potassium replacement.  Overall she is feeling much better, breathing  her typical level.  No hypoxia.  ambulatory steady gait.  Advised to discontinue amoxicillin , follow-up with allergy clinic.  Given steroids, antihistamine and EpiPen  prescription.    Xr stable  Patient in no distress and overall condition improved here in the ED. Detailed discussions were had with the patient/guardian regarding current findings, and need for close f/u with PCP or on call doctor. The patient/guardian has been instructed to return immediately if the symptoms worsen in any way for re-evaluation. Patient/guardian verbalized understanding and is in agreement with current care plan. All questions answered prior to discharge.                 Additional history obtained: -Additional history obtained from ems -External records from outside source obtained and reviewed including: Chart review including previous notes, labs, imaging, consultation notes including  Allergy list Prior ed eval   Lab Tests: -I ordered, reviewed, and interpreted labs.   The pertinent results include:   Labs Reviewed  CBC WITH DIFFERENTIAL/PLATELET - Abnormal; Notable for the following components:      Result Value   WBC 15.3 (*)    Neutro Abs 10.6 (*)    Monocytes Absolute 1.4 (*)    All other components within normal limits  BASIC METABOLIC PANEL WITH GFR - Abnormal; Notable for the following components:   Potassium 3.2 (*)    Glucose, Bld 125 (*)    All other components within normal limits  URINALYSIS, ROUTINE W REFLEX MICROSCOPIC - Abnormal; Notable for the following components:   Color, Urine STRAW (*)    Specific Gravity, Urine 1.004 (*)    All other components within normal limits  RAPID URINE DRUG SCREEN, HOSP PERFORMED - Abnormal; Notable for the following components:   Tetrahydrocannabinol POSITIVE (*)    All other components within normal limits  MAGNESIUM   PREGNANCY, URINE    Notable for as above  EKG   EKG  Interpretation Date/Time:  Friday February 05 2024 20:31:09  EDT Ventricular Rate:  90 PR Interval:  177 QRS Duration:  90 QT Interval:  382 QTC Calculation: 468 R Axis:   57  Text Interpretation: Sinus rhythm LAE, consider biatrial enlargement similar to prior Confirmed by Russella Courts ([id number]) on 02/05/2024 10:24:12 PM         Imaging Studies ordered: I ordered imaging studies including cxr I independently visualized the following imaging with scope of interpretation limited to determining acute life threatening conditions related to emergency care; findings noted above, XR stable,   Medicines ordered and prescription drug management: Meds ordered this encounter  Medications   sodium chloride  0.9 % bolus 1,000 mL   famotidine  (PEPCID ) IVPB 20 mg premix   cetirizine  (ZYRTEC  ALLERGY) 10 MG tablet    Sig: Take 1 tablet (10 mg total) by mouth daily for 14 days.    Dispense:  14 tablet    Refill:  0   predniSONE  (STERAPRED UNI-PAK 21 TAB) 10 MG (21) TBPK tablet    Sig: Take by mouth daily. Take 6 tabs by mouth daily  for 2 days, then 5 tabs for 2 days, then 4 tabs for 2 days, then 3 tabs for 2 days, 2 tabs for 2 days, then 1 tab by mouth daily for 2 days    Dispense:  42 tablet    Refill:  0   EPINEPHrine  0.3 mg/0.3 mL IJ SOAJ injection    Sig: Inject 0.3 mg into the muscle as needed for anaphylaxis.    Dispense:  1 each    Refill:  0   potassium chloride  SA (KLOR-CON  M) CR tablet 40 mEq   dexamethasone  (DECADRON ) tablet 8 mg    -I have reviewed the patients home medicines and have made adjustments as needed   Consultations Obtained: na   Cardiac Monitoring: The patient was maintained on a cardiac monitor.  I personally viewed and interpreted the cardiac monitored which showed an underlying rhythm of: sinus tachy > nsr Continuous pulse oximetry interpreted by myself, 97% on RA.    Social Determinants of Health:  Diagnosis or treatment significantly limited by social determinants of health: current smoker   Reevaluation: After  the interventions noted above, I reevaluated the patient and found that they have improved  Co morbidities that complicate the patient evaluation  Past Medical History:  Diagnosis Date   Cyst of kidney, acquired    Depression    Overdose    Suicide and self-inflicted injury (HCC)    Thyroid  disease       Dispostion: Disposition decision including need for hospitalization was considered, and patient discharged from emergency department.    Final Clinical Impression(s) / ED Diagnoses Final diagnoses:  Allergic reaction, initial encounter  Hypokalemia        Teddi Favors, DO 02/05/24 2312

## 2024-02-14 ENCOUNTER — Observation Stay (HOSPITAL_COMMUNITY)
Admission: EM | Admit: 2024-02-14 | Discharge: 2024-02-14 | Disposition: A | Discharge status: Home / Self Care | Payer: MEDICAID | Attending: Family Medicine | Admitting: Family Medicine

## 2024-02-14 ENCOUNTER — Emergency Department (HOSPITAL_COMMUNITY): Payer: MEDICAID

## 2024-02-14 ENCOUNTER — Other Ambulatory Visit: Payer: Self-pay

## 2024-02-14 ENCOUNTER — Encounter (HOSPITAL_COMMUNITY): Payer: Self-pay | Admitting: Emergency Medicine

## 2024-02-14 DIAGNOSIS — D72829 Elevated white blood cell count, unspecified: Secondary | ICD-10-CM | POA: Insufficient documentation

## 2024-02-14 DIAGNOSIS — F419 Anxiety disorder, unspecified: Secondary | ICD-10-CM | POA: Insufficient documentation

## 2024-02-14 DIAGNOSIS — M5410 Radiculopathy, site unspecified: Secondary | ICD-10-CM | POA: Diagnosis not present

## 2024-02-14 DIAGNOSIS — R202 Paresthesia of skin: Secondary | ICD-10-CM | POA: Diagnosis not present

## 2024-02-14 DIAGNOSIS — F1290 Cannabis use, unspecified, uncomplicated: Secondary | ICD-10-CM | POA: Diagnosis not present

## 2024-02-14 DIAGNOSIS — R079 Chest pain, unspecified: Principal | ICD-10-CM | POA: Diagnosis present

## 2024-02-14 DIAGNOSIS — R0789 Other chest pain: Secondary | ICD-10-CM | POA: Diagnosis present

## 2024-02-14 DIAGNOSIS — K2970 Gastritis, unspecified, without bleeding: Secondary | ICD-10-CM | POA: Insufficient documentation

## 2024-02-14 DIAGNOSIS — K2960 Other gastritis without bleeding: Secondary | ICD-10-CM | POA: Diagnosis not present

## 2024-02-14 DIAGNOSIS — E785 Hyperlipidemia, unspecified: Secondary | ICD-10-CM | POA: Diagnosis not present

## 2024-02-14 DIAGNOSIS — Z79899 Other long term (current) drug therapy: Secondary | ICD-10-CM | POA: Diagnosis not present

## 2024-02-14 DIAGNOSIS — F17210 Nicotine dependence, cigarettes, uncomplicated: Secondary | ICD-10-CM | POA: Diagnosis not present

## 2024-02-14 DIAGNOSIS — Z716 Tobacco abuse counseling: Secondary | ICD-10-CM

## 2024-02-14 DIAGNOSIS — E039 Hypothyroidism, unspecified: Secondary | ICD-10-CM | POA: Insufficient documentation

## 2024-02-14 LAB — HEMOGLOBIN A1C
Hgb A1c MFr Bld: 5.2 % (ref 4.8–5.6)
Mean Plasma Glucose: 102.54 mg/dL

## 2024-02-14 LAB — BASIC METABOLIC PANEL WITH GFR
Anion gap: 9 (ref 5–15)
BUN: 15 mg/dL (ref 6–20)
CO2: 26 mmol/L (ref 22–32)
Calcium: 9.2 mg/dL (ref 8.9–10.3)
Chloride: 103 mmol/L (ref 98–111)
Creatinine, Ser: 0.88 mg/dL (ref 0.44–1.00)
GFR, Estimated: >60 mL/min (ref >60)
Glucose, Bld: 103 mg/dL — ABNORMAL HIGH (ref 70–99)
Potassium: 3.6 mmol/L (ref 3.5–5.1)
Sodium: 138 mmol/L (ref 135–145)

## 2024-02-14 LAB — HEPATIC FUNCTION PANEL
ALT: 58 U/L — ABNORMAL HIGH (ref 0–44)
AST: 25 U/L (ref 15–41)
Albumin: 3.7 g/dL (ref 3.5–5.0)
Alkaline Phosphatase: 80 U/L (ref 38–126)
Bilirubin, Direct: 0.1 mg/dL (ref 0.0–0.2)
Indirect Bilirubin: 0.5 mg/dL (ref 0.3–0.9)
Total Bilirubin: 0.6 mg/dL (ref 0.0–1.2)
Total Protein: 6.8 g/dL (ref 6.5–8.1)

## 2024-02-14 LAB — CBC
HCT: 38.2 % (ref 36.0–46.0)
Hemoglobin: 12.6 g/dL (ref 12.0–15.0)
MCH: 31.3 pg (ref 26.0–34.0)
MCHC: 33 g/dL (ref 30.0–36.0)
MCV: 95 fL (ref 80.0–100.0)
Platelets: 334 10*3/uL (ref 150–400)
RBC: 4.02 MIL/uL (ref 3.87–5.11)
RDW: 14.2 % (ref 11.5–15.5)
WBC: 25.3 10*3/uL — ABNORMAL HIGH (ref 4.0–10.5)
nRBC: 0.1 % (ref 0.0–0.2)

## 2024-02-14 LAB — TROPONIN I (HIGH SENSITIVITY)
Troponin I (High Sensitivity): 3 ng/L (ref <18)
Troponin I (High Sensitivity): 3 ng/L (ref <18)

## 2024-02-14 LAB — TSH: TSH: 0.473 u[IU]/mL (ref 0.350–4.500)

## 2024-02-14 LAB — D-DIMER, QUANTITATIVE: D-Dimer, Quant: <0.27 ug{FEU}/mL (ref 0.00–0.50)

## 2024-02-14 MED ORDER — ALUM & MAG HYDROXIDE-SIMETH 200-200-20 MG/5ML PO SUSP: 30.0000 mL | ORAL | Status: DC | PRN

## 2024-02-14 MED ORDER — ONDANSETRON HCL 4 MG/2ML IJ SOLN: 4.0000 mg | Freq: Four times a day (QID) | INTRAMUSCULAR | Status: DC | PRN

## 2024-02-14 MED ORDER — ACETAMINOPHEN 500 MG PO TABS: 1000.0000 mg | ORAL_TABLET | Freq: Four times a day (QID) | ORAL | Status: DC | PRN

## 2024-02-14 MED ORDER — PANTOPRAZOLE SODIUM 40 MG PO TBEC: 40.0000 mg | DELAYED_RELEASE_TABLET | Freq: Every day | ORAL | 0 refills | Status: AC

## 2024-02-14 MED ORDER — NITROGLYCERIN 0.4 MG SL SUBL
0.4000 mg | SUBLINGUAL_TABLET | SUBLINGUAL | Status: DC | PRN
  Administered 2024-02-14: 0.4 mg via SUBLINGUAL
  Filled 2024-02-14: qty 1

## 2024-02-14 MED ORDER — LEVOTHYROXINE SODIUM 75 MCG PO TABS
75.0000 ug | ORAL_TABLET | Freq: Every day | ORAL | Status: DC
  Administered 2024-02-14: 75 ug via ORAL
  Filled 2024-02-14: qty 1

## 2024-02-14 MED ORDER — ASPIRIN 81 MG PO CHEW
324.0000 mg | CHEWABLE_TABLET | Freq: Once | ORAL | Status: AC
  Administered 2024-02-14: 324 mg via ORAL
  Filled 2024-02-14: qty 4

## 2024-02-14 MED ORDER — POLYETHYLENE GLYCOL 3350 17 G PO PACK: 17.0000 g | PACK | Freq: Every day | ORAL | Status: DC | PRN

## 2024-02-14 MED ORDER — NICOTINE 14 MG/24HR TD PT24
14.0000 mg | MEDICATED_PATCH | Freq: Every day | TRANSDERMAL | Status: DC
  Administered 2024-02-14: 14 mg via TRANSDERMAL
  Filled 2024-02-14: qty 1

## 2024-02-14 MED ORDER — PANTOPRAZOLE SODIUM 40 MG PO TBEC
40.0000 mg | DELAYED_RELEASE_TABLET | Freq: Every day | ORAL | Status: DC
  Administered 2024-02-14: 40 mg via ORAL
  Filled 2024-02-14: qty 1

## 2024-02-14 MED ORDER — ALBUTEROL SULFATE (2.5 MG/3ML) 0.083% IN NEBU: 2.5000 mg | INHALATION_SOLUTION | Freq: Four times a day (QID) | RESPIRATORY_TRACT | Status: DC | PRN

## 2024-02-14 MED ORDER — ALUM & MAG HYDROXIDE-SIMETH 200-200-20 MG/5ML PO SUSP
30.0000 mL | Freq: Once | ORAL | Status: AC
  Administered 2024-02-14: 30 mL via ORAL
  Filled 2024-02-14: qty 30

## 2024-02-14 MED ORDER — MELATONIN 3 MG PO TABS: 6.0000 mg | ORAL_TABLET | Freq: Every evening | ORAL | Status: DC | PRN

## 2024-02-14 MED ORDER — SODIUM CHLORIDE 0.9% FLUSH
3.0000 mL | Freq: Two times a day (BID) | INTRAVENOUS | Status: DC
  Administered 2024-02-14: 3 mL via INTRAVENOUS

## 2024-02-14 MED ORDER — NICOTINE POLACRILEX 2 MG MT GUM
2.0000 mg | CHEWING_GUM | OROMUCOSAL | Status: DC | PRN
Start: 2024-02-14 — End: 2024-02-14

## 2024-02-14 NOTE — Progress Notes (Signed)
Patient arrived to room 335 from ED.  Assessment complete, VS obtained, and Admission database began.

## 2024-02-14 NOTE — Plan of Care (Signed)

## 2024-02-14 NOTE — ED Provider Notes (Signed)
 [place] EMERGENCY DEPARTMENT AT [facility]  Provider Note  CSN: [id number] Arrival date & time: 02/14/24 0208  History Chief Complaint  Patient presents with  . Chest Pain    [name] is a 48 y.o. female with history of HLD, hypothyroid and tobacco use reports about a week of intermittent R sided chest pain, worse when she is stressed, lasts for 20-66min and radiates into R arm. She reports pain was worse and more persistent tonight. She feels SOB and anxious.    Home Medications Prior to Admission medications   Medication Sig Start Date End Date Taking? Authorizing Provider  benzonatate  (TESSALON ) 200 MG capsule Take 1 capsule (200 mg total) by mouth 3 (three) times daily as needed for cough. Patient not taking: Reported on 07/11/2021 07/05/20   Carlton Chick, MD  BLACK ELDERBERRY PO Take 2 tablets by mouth daily.    [provider]  cephALEXin  (KEFLEX ) 500 MG capsule Take 1 capsule (500 mg total) by mouth 4 (four) times daily. Patient not taking: Reported on 07/05/2020 04/15/18   Sueellen Emery, MD  cetirizine  (ZYRTEC  ALLERGY) 10 MG tablet Take 1 tablet (10 mg total) by mouth daily for 14 days. 02/05/24 02/19/24  Teddi Favors, DO  Cholecalciferol (VITAMIN D-3) 125 MCG (5000 UT) TABS Take 1 tablet by mouth daily.    [provider]  citalopram  (CELEXA ) 20 MG tablet Take 1 tablet (20 mg total) by mouth daily. For depression 02/02/13   Asuncion Layer I, NP  EPINEPHrine  0.3 mg/0.3 mL IJ SOAJ injection Inject 0.3 mg into the muscle as needed for anaphylaxis. 02/05/24   Teddi Favors, DO  HYDROcodone -acetaminophen  (NORCO/VICODIN) 5-325 MG tablet Take 1 tablet by mouth every 4 (four) hours as needed. Patient not taking: Reported on 07/05/2020 04/15/18   Haviland, Julie, MD  levothyroxine (SYNTHROID) 75 MCG tablet Take 75 mcg by mouth daily. 06/11/20   [provider]  ondansetron  (ZOFRAN  ODT) 4 MG disintegrating tablet Take 1 tablet (4 mg total) by  mouth every 8 (eight) hours as needed. Patient not taking: Reported on 07/05/2020 04/15/18   Sueellen Emery, MD  ondansetron  (ZOFRAN ) 4 MG tablet Take 1 tablet (4 mg total) by mouth every 6 (six) hours as needed for nausea or vomiting. 03/30/22   Prosperi, Christian H, PA-C  phenazopyridine  (PYRIDIUM ) 200 MG tablet Take 1 tablet (200 mg total) by mouth 3 (three) times daily. Patient not taking: Reported on 07/05/2020 04/15/18   Sueellen Emery, MD  predniSONE  (STERAPRED UNI-PAK 21 TAB) 10 MG (21) TBPK tablet Take by mouth daily. Take 6 tabs by mouth daily  for 2 days, then 5 tabs for 2 days, then 4 tabs for 2 days, then 3 tabs for 2 days, 2 tabs for 2 days, then 1 tab by mouth daily for 2 days 02/05/24   Teddi Favors, DO     Allergies    Penicillins and Sulfa antibiotics   Review of Systems   Review of Systems Please see HPI for pertinent positives and negatives  Physical Exam BP (!) 156/88 (BP Location: Left Arm)   Pulse 78   Temp 98.4 F (36.9 C) (Oral)   Resp 18   Ht 5\' 3"  (1.6 m)   Wt 70 kg   LMP 06/21/2011   SpO2 99%   BMI 27.34 kg/m   Physical Exam Vitals and nursing note reviewed.  Constitutional:      Appearance: Normal appearance.  HENT:     Head: Normocephalic  and atraumatic.     Nose: Nose normal.     Mouth/Throat:     Mouth: Mucous membranes are moist.  Eyes:     Extraocular Movements: Extraocular movements intact.     Conjunctiva/sclera: Conjunctivae normal.  Cardiovascular:     Rate and Rhythm: Tachycardia present.  Pulmonary:     Effort: Pulmonary effort is normal.     Breath sounds: Normal breath sounds.  Abdominal:     General: Abdomen is flat.     Palpations: Abdomen is soft.     Tenderness: There is no abdominal tenderness.  Musculoskeletal:        General: No swelling. Normal range of motion.     Cervical back: Neck supple.     Right lower leg: No edema.     Left lower leg: No edema.  Skin:    General: Skin is warm and dry.  Neurological:      General: No focal deficit present.     Mental Status: She is alert.  Psychiatric:     Comments: anxious    ED Results / Procedures / Treatments   EKG EKG Interpretation Date/Time:  Sunday February 14 2024 02:18:15 EDT Ventricular Rate:  78 PR Interval:  136 QRS Duration:  78 QT Interval:  404 QTC Calculation: 461 R Axis:   61  Text Interpretation: Sinus rhythm Non-specific ST-t changes Baseline wander Confirmed by Shawnee Dellen [phone]) on 02/14/2024 2:21:25 AM  Procedures Procedures  Medications Ordered in the ED Medications  aspirin chewable tablet 324 mg (has no administration in time range)  nitroGLYCERIN (NITROSTAT) SL tablet 0.4 mg (has no administration in time range)    Initial Impression and Plan  Patient here with intermittent right sided chest pain, more persistent tonight. Will check labs, EKG, CXR. ASA/NTG ordered.   ED Course   Clinical Course as of 02/14/24 0502  Sun Feb 14, 2024  0237 I personally viewed the images from radiology studies and agree with radiologist interpretation: CXR is clear [CS]  0256 CBC with leukocytosis of unclear etiology. Was recently treated for sinus infection and then a subsequent allergic reaction with steroids.  [CS]  0315 BMP and Trop #1 are normal.  [CS]  [id number] Patient reports pain resolved after NTG.  [CS]  0438 Repeat troponin remains normal. She remains pain free after NTG. She has a HEART score of 4, moderate risk for MACE. Will discuss admission with hospitalist. Patient is amenable to that plan.  [CS]  0501 Spoke with Dr. Andilynn Kansky, Hospitalist, who will evaluate the patient [CS]    Clinical Course User Index [CS] Charmayne Cooper, MD     MDM Rules/Calculators/A&P Medical Decision Making Problems Addressed: Chest pain, unspecified type: acute illness or injury  Amount and/or Complexity of Data Reviewed Labs: ordered. Decision-making details documented in ED Course. Radiology: ordered and independent interpretation  performed. Decision-making details documented in ED Course. ECG/medicine tests: ordered and independent interpretation performed. Decision-making details documented in ED Course.  Risk OTC drugs. Prescription drug management. Decision regarding hospitalization.     Final Clinical Impression(s) / ED Diagnoses Final diagnoses:  None    Rx / DC Orders ED Discharge Orders     None        Charmayne Cooper, MD 02/14/24 0502

## 2024-02-14 NOTE — ED Triage Notes (Signed)
 Pt with c/o  R sided chest pain x 1 week that radiates to R arm and hand.

## 2024-02-14 NOTE — Discharge Summary (Signed)
 Physician Discharge Summary   Patient: [name] MRN: [id number] DOB: [birth year]  Admit date:     02/14/2024  Discharge date: 02/14/24  Discharge Physician: Wynetta Heckle   PCP: Bryna Car [facility]   Recommendations at discharge:  Follow up with PCP as soon as possible Continue management of anxiety with SSRI, consider additional agent based on response. Also suggest trauma-informed counseling. Follow up reflux, starting PPI for suspected GERD.  Consider outpatient cardiology evaluation including echocardiogram after discharge. 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   Discharge Diagnoses: Principal Problem:   Chest pain Active Problems:   Gastritis   Leukocytosis   Encounter for smoking cessation counseling  HPI:  [name] [name] is a 48 y.o. female with hx of hyperlipidemia, current smoking, hypothyroidism, who presented with chest pain.  Reports that she has had 1 week of intermittent right-sided chest pain.  Described as burning " heartburn".  Symptoms are nonexertional and occur at rest.  Duration for multiple hours and then resolve for hours at a time before coming back. Symptoms were relieved with nitroglycerin in the ED. Reports associated shortness of breath worse than her chronic shortness of breath which she relates to smoking.  Also reports pain rating down her right arm, paresthesias in the right hand.  However notes that she has chronic neck pain with radiating pain down into her right arm.  No injuries or fall.  Also reports chronic paresthesias in both hands ongoing for years.  Otherwise notes recent bright red blood on toilet paper and black stools.  No prior history of CAD, remote history of stress test in her 66s.   She was placed in observation and her symptoms have abated. Work up has been negative for ACS. Hospitalization has unveiled significant emotional distress which may be contributing to symptoms. VSS. Symptoms resolved,  pt wishes to discharge with outpatient follow up in lieu of additional work up (e.g. stress testing/echo) here.   Assessment and Plan: Chest pain, atypical: Atypical quality with R sided, prolonged duration, nonexertional. However relieved with NTG in the ED. EKG no ischemic changes. HS trop neg x 2. CXR negative. HEART score is 2-3 (~ moderate suspicion d/t relief with NTG, age, 2 risk factors for CAD with smoking and HLD). She is low risk for MACE thus not sure that inpatient stress testing or TTE is needed. Suspect that her symptoms are likely GI in origin with meds including Augmentin , Doxycycline , Prednisone  recently Rx'd for sinus infection + reports recent dark stool which would support gastritis.  - d-dimer negative, troponin level is 3 > 3, no ST elevations on ECG. No signs or symptoms of heart failure. It appears admission to the hospital has precipitated significant emotional distress, so will forgo further work up now that her symptoms have resolved.  - Continue PPI for suspicion of gastritis and/or GERD. Consider EGD if symptoms persist. Avoid NSAIDs and alcohol. NTG-induced relief of symptoms may be treating esophageal spasm.    Suspect gastritis: ?trickle GI bleeding. Hgb remains wnl. BUN also not elevated. No bleeding evident during observation period.  - Start on pantoprazole  40 mg daily.     Leukocytosis  Presumed secondary to steroid use / recent sinusitis. No fever.   Tobacco use: Cessation counseling provided.     Cannabis use: +THC. Vapes hemp.  - Denied any other drug use on my interview  Anxiety, emotional distress: Pt bursts into tears upon initiating interview after arriving to the floor this  morning. Her husband who was her world died 4 years ago on 02-25-24 (2 days from now) and she also lists several other family members, all who have died in past couple years. 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  - SSRI   - PCP follow up, counseling referral suggested.    Chronic radicular pain RUE  Chronic paresthesias to bilateral hands  Likely cervical radiculopathy on the R, possible neuropathy affecting both hands. On exam with distal glove distribution hypoesthesia and no focal deficits.  -Should follow-up with primary care outpatient to consider C-spine MRI, further evaluation of neuropathy   Hyperlipidemia: Lipid panel pending at discharge. Hypothyroidism: Continue home levothyroxine. TSH wnl. Suspected COPD: Should have PFT outpatient, albuterol  prn  Consultants: None Procedures performed: None  Disposition: Home Diet recommendation:  Cardiac diet DISCHARGE MEDICATION: Allergies as of 02/14/2024       Reactions   Penicillins Shortness Of Breath, Nausea And Vomiting   Sulfa Antibiotics Itching, Rash, Other (See Comments)   Internal "burning"        Medication List     STOP taking these medications    benzonatate  200 MG capsule Commonly known as: TESSALON    cephALEXin  500 MG capsule Commonly known as: KEFLEX    HYDROcodone -acetaminophen  5-325 MG tablet Commonly known as: NORCO/VICODIN   ondansetron  4 MG disintegrating tablet Commonly known as: Zofran  ODT   phenazopyridine  200 MG tablet Commonly known as: PYRIDIUM        TAKE these medications    BLACK ELDERBERRY PO Take 2 tablets by mouth daily.   cetirizine  10 MG tablet Commonly known as: ZyrTEC  Allergy Take 1 tablet (10 mg total) by mouth daily for 14 days.   citalopram  20 MG tablet Commonly known as: CELEXA  Take 1 tablet (20 mg total) by mouth daily. For depression   EPINEPHrine  0.3 mg/0.3 mL Soaj injection Commonly known as: EPI-PEN Inject 0.3 mg into the muscle as needed for anaphylaxis.   levothyroxine 75 MCG tablet Commonly known as: SYNTHROID Take 75 mcg by mouth daily.   ondansetron  4 MG tablet Commonly known as: ZOFRAN  Take 1 tablet (4 mg total) by mouth every 6 (six) hours as needed for nausea  or vomiting.   pantoprazole  40 MG tablet Commonly known as: PROTONIX  Take 1 tablet (40 mg total) by mouth daily. Start taking on: February 15, 2024   predniSONE  10 MG (21) Tbpk tablet Commonly known as: STERAPRED UNI-PAK 21 TAB Take by mouth daily. Take 6 tabs by mouth daily  for 2 days, then 5 tabs for 2 days, then 4 tabs for 2 days, then 3 tabs for 2 days, 2 tabs for 2 days, then 1 tab by mouth daily for 2 days   Vitamin D-3 125 MCG (5000 UT) Tabs Take 1 tablet by mouth daily.        Follow-up Information     [facility] Follow up in 1 week(s).   Contact information: [address] [place] [address] [phone]                Discharge Exam: Filed Weights   02/14/24 0214  Weight: 70 kg  47yo F sitting up in bed with mild tremor and extremely labile mood, tearful at times. No AVH/SI/HI, internal stimuli, psychosis, etc.  RRR, no MRG or edema. Pt also denied orthopnea, PND, leg swelling.  Clear, nonlabored  Condition at discharge: stable  The results of significant diagnostics from this  hospitalization (including imaging, microbiology, ancillary and laboratory) are listed below for reference.   Imaging Studies: DG Chest Portable 1 View Result Date: 02/14/2024 CLINICAL DATA:  Right chest pain EXAM: PORTABLE CHEST 1 VIEW COMPARISON:  02/05/2024 FINDINGS: Lungs are clear.  No pleural effusion or pneumothorax. The heart is normal in size. IMPRESSION: No active disease. Electronically Signed   By: Zadie Herter M.D.   On: 02/14/2024 02:34   DG Chest Portable 1 View Result Date: 02/05/2024 CLINICAL DATA:  Shortness of breath with suspected allergic reaction. EXAM: PORTABLE CHEST 1 VIEW COMPARISON:  August 27, 2022 FINDINGS: The heart size and mediastinal contours are within normal limits. Both lungs are clear. The visualized skeletal structures are unremarkable. IMPRESSION: No active disease. Electronically Signed   By: Virgle Grime M.D.   On:  02/05/2024 23:07    Microbiology: Results for orders placed or performed during the hospital encounter of 06/29/20  Group A Strep by PCR     Status: None   Collection Time: 06/29/20  1:46 PM   Specimen: Throat; Sterile Swab  Result Value Ref Range Status   Group A Strep by PCR NOT DETECTED NOT DETECTED Final    Comment: Performed at [facility], [address], [place], [place] [address]  SARS Coronavirus 2 by RT PCR (hospital order, performed in [facility] hospital lab) Nasopharyngeal Nasopharyngeal Swab     Status: Abnormal   Collection Time: 06/29/20  5:20 PM   Specimen: Nasopharyngeal Swab  Result Value Ref Range Status   SARS Coronavirus 2 POSITIVE (A) NEGATIVE Final    Comment: RESULT CALLED TO, READ BACK BY AND VERIFIED WITH: GENTRY,R @ 1826 ON 06/29/20 BY JUW (NOTE) SARS-CoV-2 target nucleic acids are DETECTED  SARS-CoV-2 RNA is generally detectable in upper respiratory specimens  during the acute phase of infection.  Positive results are indicative  of the presence of the identified virus, but do not rule out bacterial infection or co-infection with other pathogens not detected by the test.  Clinical correlation with patient history and  other diagnostic information is necessary to determine patient infection status.  The expected result is negative.  Fact Sheet for Patients:   BoilerBrush.com.cy   Fact Sheet for Healthcare Providers:   https://pope.com/    This test is not yet approved or cleared by the [place]  FDA and  has been authorized for detection and/or diagnosis of SARS-CoV-2 by FDA under an Emergency Use Authorization (EUA).  This EUA will remain in effect (meaning this tes t can be used) for the duration of  the COVID-19 declaration under Section 564(b)(1) of the Act, 21 U.S.C. section 360-bbb-3(b)(1), unless the authorization is terminated or revoked sooner.  Performed at [facility], [address] [place], [place] [address]     Labs: CBC: Recent Labs  Lab 02/14/24 0217  WBC 25.3*  HGB 12.6  HCT 38.2  MCV 95.0  PLT 334   Basic Metabolic Panel: Recent Labs  Lab 02/14/24 0217  NA 138  K 3.6  CL 103  CO2 26  GLUCOSE 103*  BUN 15  CREATININE 0.88  CALCIUM 9.2   Liver Function Tests: Recent Labs  Lab 02/14/24 0404  AST 25  ALT 58*  ALKPHOS 80  BILITOT 0.6  PROT 6.8  ALBUMIN 3.7   CBG: No results for input(s): "GLUCAP" in the last 168 hours.  Discharge time spent: greater than 30 minutes.  Signed: Wynetta Heckle, MD [facility] 02/14/2024

## 2024-02-14 NOTE — H&P (Addendum)
 History and Physical    Dell LENNETTE PEACHER ZOX:[id number] DOB: 25-Mar-1976 DOA: 02/14/2024  PCP: Bryna Car [facility]   Patient coming from: Home   Chief Complaint:  Chief Complaint  Patient presents with   Chest Pain    HPI:  [name] is a 48 y.o. female with hx of hyperlipidemia, current smoking, hypothyroidism, who presented with chest pain.  Reports that she has had 1 week of intermittent right-sided chest pain.  Described as burning " heartburn".  Symptoms are nonexertional and occur at rest.  Duration for multiple hours and then resolve for hours at a time before coming back. Symptoms were relieved with nitroglycerin in the ED. Reports associated shortness of breath worse than her chronic shortness of breath which she relates to smoking.  Also reports pain rating down her right arm, paresthesias in the right hand.  However notes that she has chronic neck pain with radiating pain down into her right arm.  No injuries or fall.  Also reports chronic paresthesias in both hands ongoing for years.  Otherwise notes recent bright red blood on toilet paper and black stools.  No prior history of CAD, remote history of stress test in her 53s.    Review of Systems:  ROS complete and negative except as marked above   Allergies  Allergen Reactions   Penicillins Shortness Of Breath and Nausea And Vomiting   Sulfa Antibiotics Itching, Rash and Other (See Comments)    Internal "burning"    Prior to Admission medications   Medication Sig Start Date End Date Taking? Authorizing Provider  benzonatate  (TESSALON ) 200 MG capsule Take 1 capsule (200 mg total) by mouth 3 (three) times daily as needed for cough. Patient not taking: Reported on 07/11/2021 07/05/20   Carlton Chick, MD  BLACK ELDERBERRY PO Take 2 tablets by mouth daily.    [provider]  cephALEXin  (KEFLEX ) 500 MG capsule Take 1 capsule (500 mg total) by mouth 4 (four) times daily. Patient not taking: Reported on 07/05/2020  04/15/18   Sueellen Emery, MD  cetirizine  (ZYRTEC  ALLERGY) 10 MG tablet Take 1 tablet (10 mg total) by mouth daily for 14 days. 02/05/24 02/19/24  Teddi Favors, DO  Cholecalciferol (VITAMIN D-3) 125 MCG (5000 UT) TABS Take 1 tablet by mouth daily.    [provider]  citalopram  (CELEXA ) 20 MG tablet Take 1 tablet (20 mg total) by mouth daily. For depression 02/02/13   Asuncion Layer I, NP  EPINEPHrine  0.3 mg/0.3 mL IJ SOAJ injection Inject 0.3 mg into the muscle as needed for anaphylaxis. 02/05/24   Teddi Favors, DO  HYDROcodone -acetaminophen  (NORCO/VICODIN) 5-325 MG tablet Take 1 tablet by mouth every 4 (four) hours as needed. Patient not taking: Reported on 07/05/2020 04/15/18   Haviland, Julie, MD  levothyroxine (SYNTHROID) 75 MCG tablet Take 75 mcg by mouth daily. 06/11/20   [provider]  ondansetron  (ZOFRAN  ODT) 4 MG disintegrating tablet Take 1 tablet (4 mg total) by mouth every 8 (eight) hours as needed. Patient not taking: Reported on 07/05/2020 04/15/18   Sueellen Emery, MD  ondansetron  (ZOFRAN ) 4 MG tablet Take 1 tablet (4 mg total) by mouth every 6 (six) hours as needed for nausea or vomiting. 03/30/22   Prosperi, Christian H, PA-C  phenazopyridine  (PYRIDIUM ) 200 MG tablet Take 1 tablet (200 mg total) by mouth 3 (three) times daily. Patient not taking: Reported on 07/05/2020 04/15/18   Sueellen Emery, MD  predniSONE  (STERAPRED UNI-PAK 21 TAB) 10 MG (21)  TBPK tablet Take by mouth daily. Take 6 tabs by mouth daily  for 2 days, then 5 tabs for 2 days, then 4 tabs for 2 days, then 3 tabs for 2 days, 2 tabs for 2 days, then 1 tab by mouth daily for 2 days 02/05/24   Teddi Favors, DO    Past Medical History:  Diagnosis Date   Cyst of kidney, acquired    Depression    Overdose    Suicide and self-inflicted injury [facility])    Thyroid  disease     Past Surgical History:  Procedure Laterality Date   ABDOMINAL HYSTERECTOMY     CESAREAN SECTION     OVARIAN CYST REMOVAL        reports that she has been smoking cigarettes. She has never used smokeless tobacco. She reports that she does not currently use alcohol. She reports that she does not currently use drugs.  History reviewed. No pertinent family history.   Physical Exam: Vitals:   02/14/24 0345 02/14/24 0400 02/14/24 0415 02/14/24 0430  BP: (!) 148/73 129/89 (!) 144/80 126/71  Pulse: 70 71 75 70  Resp: 16 18 (!) 26 20  Temp:      TempSrc:      SpO2: 98% 97% 97% 96%  Weight:      Height:        Gen: Awake, alert, NAD   CV: Regular, normal S1, S2, no murmurs  Resp: Normal WOB, Diminished air movement, diffuse wheezes  Abd: Flat, normoactive, nontender MSK: Symmetric, Trace edema bilaterally  Skin: No rashes or lesions to exposed skin  Neuro: Alert and interactive. Strength is 5/5 in the RUE, hypoesthesia distally in the fingers in glover pattern. Muscle bulk in the hands is normal.  Psych: euthymic, appropriate    Data review:   Labs reviewed, notable for:   High-sensitivity troponin negative x 2 WBC 25   Micro:  Results for orders placed or performed during the hospital encounter of 06/29/20  Group A Strep by PCR     Status: None   Collection Time: 06/29/20  1:46 PM   Specimen: Throat; Sterile Swab  Result Value Ref Range Status   Group A Strep by PCR NOT DETECTED NOT DETECTED Final    Comment: Performed at [facility], [address], [place], [place] [address]  SARS Coronavirus 2 by RT PCR (hospital order, performed in [facility] hospital lab) Nasopharyngeal Nasopharyngeal Swab     Status: Abnormal   Collection Time: 06/29/20  5:20 PM   Specimen: Nasopharyngeal Swab  Result Value Ref Range Status   SARS Coronavirus 2 POSITIVE (A) NEGATIVE Final    Comment: RESULT CALLED TO, READ BACK BY AND VERIFIED WITH: GENTRY,R @ 1826 ON 06/29/20 BY JUW (NOTE) SARS-CoV-2 target nucleic acids are DETECTED  SARS-CoV-2 RNA is generally detectable in upper respiratory specimens  during the acute  phase of infection.  Positive results are indicative  of the presence of the identified virus, but do not rule out bacterial infection or co-infection with other pathogens not detected by the test.  Clinical correlation with patient history and  other diagnostic information is necessary to determine patient infection status.  The expected result is negative.  Fact Sheet for Patients:   BoilerBrush.com.cy   Fact Sheet for Healthcare Providers:   https://pope.com/    This test is not yet approved or cleared by the [place]  FDA and  has been authorized for detection and/or diagnosis of SARS-CoV-2 by FDA under an Emergency  Use Authorization (EUA).  This EUA will remain in effect (meaning this tes t can be used) for the duration of  the COVID-19 declaration under Section 564(b)(1) of the Act, 21 U.S.C. section 360-bbb-3(b)(1), unless the authorization is terminated or revoked sooner.  Performed at [facility], [address], [place], [place] [address]     Imaging reviewed:  DG Chest Portable 1 View Result Date: 02/14/2024 CLINICAL DATA:  Right chest pain EXAM: PORTABLE CHEST 1 VIEW COMPARISON:  02/05/2024 FINDINGS: Lungs are clear.  No pleural effusion or pneumothorax. The heart is normal in size. IMPRESSION: No active disease. Electronically Signed   By: Zadie Herter M.D.   On: 02/14/2024 02:34    EKG:  Personally reviewed, sinus rhythm, no acute ischemic changes  ED Course:  Treated with aspirin 324 mg x 1, nitroglycerin sublingual.  Pain relieved after nitroglycerin   Assessment/Plan:  48 y.o. female with hx hyperlipidemia, current smoking, hypothyroidism, who presents with chest pain.    Chest pain, atypical  Atypical quality with R sided, prolonged duration, nonexertional. However relieved with NTG in the ED. EKG no ischemic changes. HS trop neg x 2. CXR negative. HEART score is 2-3 (~ moderate suspicion d/t relief  with NTG, age, 2 risk factors for CAD with smoking and HLD). She is low risk for MACE thus not sure that inpatient stress testing or TTE is needed. Suspect that her symptoms are likely GI in origin with meds including Augmentin , Doxycycline , Prednisone  recently Rx'd for sinus infection + reports recent dark stool which would support gastritis. However will evaluate for alternate etiology including PE with her initial tachycardia. Obs for additional workup of alternate etiologies and can touch base with cardiology re: their thoughts on inpatient cardiac testing.  - Routine cardiology consult in the morning re: need for inpatient cardiac testing (not contacted overnight)  - S/p aspirin 324 mg load, hold on daily aspirin for now - Check lipids and A1c - Check D-dimer, if elevated CTA for PE - Empiric treatment for likely gastritis per below  Suspect gastritis Question GI bleeding  See discussion above, has multiple risk factors for gastritis and reports recent bright red blood on toilet paper and black/melenic stool. hemoglobin 12 similar to prior ED visit.  - Repeat hemoglobin at 2 PM for trend - Check FOBT - Empiric treatment for gastritis, give GI cocktail and start on pantoprazole  40 mg daily. Hold on IV PPI since lower suspicion for active GI bleeding.   Leukocytosis  Presumed secondary to steroid use / recent sinusitis.   Smoking cessation She is interested in stopping smoking.  Currently smokes 1 pack a day but also vaping recently.  - Counseled on smoking cessation - Nicotine  patch 14 mg and gum for cravings; would Rx at discharge  Tox positive THC - Denied any other drug use on my interview  Chronic radicular pain RUE  Chronic paresthesias to bilateral hands  Likely cervical radiculopathy on the R, possible neuropathy affecting both hands. On exam with glove distribution hypoesthesia and no focal deficits.  -Should follow-up with primary care outpatient to consider C-spine MRI,  further evaluation of neuropathy  Chronic medical problems: Hyperlipidemia: Checking lipids above Hypothyroidism: Continue home levothyroxine Suspected COPD: Should have PFT outpatient, albuterol  prn   Body mass index is 27.34 kg/m.    DVT prophylaxis:  SCDs Code Status:  Full Code Diet:  Diet Orders (From admission, onward)     Start     Ordered   02/14/24 0533  Diet Heart Room service appropriate? Yes; Fluid consistency: Thin  Diet effective now       Question Answer Comment  Room service appropriate? Yes   Fluid consistency: Thin      02/14/24 0536           Family Communication:  No   Consults:  None   Admission status:   Observation, Telemetry bed  Severity of Illness: The appropriate patient status for this patient is OBSERVATION. Observation status is judged to be reasonable and necessary in order to provide the required intensity of service to ensure the patient's safety. The patient's presenting symptoms, physical exam findings, and initial radiographic and laboratory data in the context of their medical condition is felt to place them at decreased risk for further clinical deterioration. Furthermore, it is anticipated that the patient will be medically stable for discharge from the hospital within 2 midnights of admission.    Arnulfo Larch, MD [facility]  How to contact the [facility] Attending or Consulting provider 7A - 7P or covering provider during after hours 7P -7A, for this patient.  Check the care team in [facility] and look for a) attending/consulting [facility] provider listed and b) the [facility] team listed Log into [email or web address] and use [facility] universal password to access. If you do not have the password, please contact the hospital operator. Locate the [facility] provider you are looking for under [facility] and page to a number that you can be directly reached. If you still have difficulty reaching the provider, please page the [facility] (Director on Call) for  the Hospitalists listed on amion for assistance.  02/14/2024, 5:38 AM

## 2024-03-11 ENCOUNTER — Encounter: Payer: Self-pay | Admitting: Internal Medicine

## 2024-03-11 ENCOUNTER — Encounter: Payer: Self-pay | Admitting: *Deleted

## 2024-03-11 ENCOUNTER — Ambulatory Visit: Payer: MEDICAID | Attending: Internal Medicine | Admitting: Internal Medicine

## 2024-03-11 VITALS — BP 130/68 | HR 88 | Ht 63.0 in | Wt 150.0 lb

## 2024-03-11 DIAGNOSIS — R079 Chest pain, unspecified: Secondary | ICD-10-CM | POA: Diagnosis present

## 2024-03-11 NOTE — Patient Instructions (Addendum)
 Medication Instructions:   Continue all current medications.   Labwork:  none  Testing/Procedures:  Your physician has requested that you have an echocardiogram. Echocardiography is a painless test that uses sound waves to create images of your heart. It provides your doctor with information about the size and shape of your heart and how well your heart's chambers and valves are working. This procedure takes approximately one hour. There are no restrictions for this procedure. Please do NOT wear cologne, perfume, aftershave, or lotions (deodorant is allowed). Please arrive 15 minutes prior to your appointment time.  Please note: We ask at that you not bring children with you during ultrasound (echo/ vascular) testing. Due to room size and safety concerns, children are not allowed in the ultrasound rooms during exams. Our front office staff cannot provide observation of children in our lobby area while testing is being conducted. An adult accompanying a patient to their appointment will only be allowed in the ultrasound room at the discretion of the ultrasound technician under special circumstances. We apologize for any inconvenience. Your physician has requested that you have a lexiscan myoview. For further information please visit [email or web address] Please follow instruction sheet, as given. Office will contact with results via phone, letter or mychart.     Follow-Up:  As needed   Any Other Special Instructions Will Be Listed Below (If Applicable).   If you need a refill on your cardiac medications before your next appointment, please call your pharmacy.

## 2024-03-11 NOTE — Progress Notes (Unsigned)
 Cardiology Office Note  Date: 03/11/2024   ID: [name], DOB 03-Mar-1976, MRN [id number]  PCP:  Bryna Car, [facility]  Cardiologist:  None Electrophysiologist:  None   History of Present Illness: [name] is a 48 y.o. female  Past Medical History:  Diagnosis Date   Cyst of kidney, acquired    Depression    Overdose    Suicide and self-inflicted injury (HCC)    Thyroid  disease     Past Surgical History:  Procedure Laterality Date   ABDOMINAL HYSTERECTOMY     CESAREAN SECTION     OVARIAN CYST REMOVAL      Current Outpatient Medications  Medication Sig Dispense Refill   cetirizine  (ZYRTEC  ALLERGY) 10 MG tablet Take 1 tablet (10 mg total) by mouth daily for 14 days. 14 tablet 0   citalopram  (CELEXA ) 20 MG tablet Take 1 tablet (20 mg total) by mouth daily. For depression 30 tablet 0   EPINEPHrine  0.3 mg/0.3 mL IJ SOAJ injection Inject 0.3 mg into the muscle as needed for anaphylaxis. 1 each 0   levothyroxine  (SYNTHROID ) 25 MCG tablet Take 1 tablet by mouth daily.     ondansetron  (ZOFRAN ) 4 MG tablet Take 1 tablet (4 mg total) by mouth every 6 (six) hours as needed for nausea or vomiting. 20 tablet 0   pantoprazole  (PROTONIX ) 40 MG tablet Take 1 tablet (40 mg total) by mouth daily. 30 tablet 0   No current facility-administered medications for this visit.   Allergies:  Penicillins and Sulfa antibiotics   Social History: The patient  reports that she has been smoking cigarettes. She has never used smokeless tobacco. She reports that she does not currently use alcohol. She reports that she does not currently use drugs.   Family History: The patient's family history is not on file.   ROS:  Please see the history of present illness. Otherwise, complete review of systems is positive for none  All other systems are reviewed and negative.   Physical Exam: VS:  Ht 5\' 3"  (1.6 m)   Wt 150 lb (68 kg)   LMP 06/21/2011   BMI 26.57 kg/m , BMI Body mass index is  26.57 kg/m.  Wt Readings from Last 3 Encounters:  03/11/24 150 lb (68 kg)  02/14/24 154 lb 5.2 oz (70 kg)  02/05/24 155 lb (70.3 kg)    General: Patient appears comfortable at rest. HEENT: Conjunctiva and lids normal, oropharynx clear with moist mucosa. Neck: Supple, no elevated JVP or carotid bruits, no thyromegaly. Lungs: Clear to auscultation, nonlabored breathing at rest. Cardiac: Regular rate and rhythm, no S3 or significant systolic murmur, no pericardial rub. Abdomen: Soft, nontender, no hepatomegaly, bowel sounds present, no guarding or rebound. Extremities: No pitting edema, distal pulses 2+. Skin: Warm and dry. Musculoskeletal: No kyphosis. Neuropsychiatric: Alert and oriented x3, affect grossly appropriate.  Recent Labwork: 02/05/2024: Magnesium  1.7 02/14/2024: ALT 58; AST 25; BUN 15; Creatinine, Ser 0.88; Hemoglobin 12.6; Platelets 334; Potassium 3.6; Sodium 138; TSH 0.473     Component Value Date/Time   CHOL 177 09/29/2022 1139   TRIG 157 (H) 09/29/2022 1139   HDL 36 (L) 09/29/2022 1139   CHOLHDL 4.9 09/29/2022 1139   VLDL 31 09/29/2022 1139   LDLCALC 110 (H) 09/29/2022 1139    Other Studies Reviewed Today:   Assessment and Plan:         Medication Adjustments/Labs and Tests Ordered: Current medicines are reviewed at length with the patient today.  Concerns regarding medicines are outlined above.    Disposition:  Follow up {follow up:15908}  Signed Nastasha Reising Priya Ski Polich, MD, 03/11/2024 11:15 AM    [facility] [address] [place], [place], [place] [address]

## 2024-03-16 ENCOUNTER — Telehealth (HOSPITAL_COMMUNITY): Payer: Self-pay

## 2024-03-17 ENCOUNTER — Encounter (HOSPITAL_COMMUNITY): Payer: MEDICAID

## 2024-03-17 ENCOUNTER — Ambulatory Visit (HOSPITAL_COMMUNITY): Admission: RE | Admit: 2024-03-17 | Payer: MEDICAID | Source: Ambulatory Visit

## 2024-04-04 ENCOUNTER — Ambulatory Visit: Payer: MEDICAID | Attending: Internal Medicine

## 2024-04-04 DIAGNOSIS — R079 Chest pain, unspecified: Secondary | ICD-10-CM | POA: Insufficient documentation

## 2024-04-04 LAB — ECHOCARDIOGRAM COMPLETE
AR max vel: 2.31 cm2
AV Area VTI: 2.27 cm2
AV Area mean vel: 2.16 cm2
AV Mean grad: 5 mmHg
AV Peak grad: 10.5 mmHg
Ao pk vel: 1.62 m/s
Area-P 1/2: 4.31 cm2
Calc EF: 56.2 %
MV VTI: 1.67 cm2
S' Lateral: 3.4 cm
Single Plane A2C EF: 56.3 %
Single Plane A4C EF: 53.3 %

## 2024-04-06 ENCOUNTER — Ambulatory Visit: Payer: Self-pay | Admitting: Internal Medicine

## 2024-04-07 ENCOUNTER — Telehealth (HOSPITAL_COMMUNITY): Payer: Self-pay | Admitting: Surgery

## 2024-04-07 NOTE — Telephone Encounter (Signed)
 I called the pt to remind her of her stress test tomorrow, and the pt stated that she needed to reschedule since her was currently being worked on.  I gave the pt the [facility] cardiology office number to reschedule.

## 2024-04-08 ENCOUNTER — Encounter (HOSPITAL_COMMUNITY): Payer: MEDICAID

## 2024-04-08 ENCOUNTER — Ambulatory Visit (HOSPITAL_COMMUNITY): Payer: MEDICAID

## 2024-04-12 NOTE — Telephone Encounter (Signed)
 The patient has been notified of the result and verbalized understanding.  All questions (if any) were answered. [name], CMA 04/12/2024 4:33 PM

## 2024-04-12 NOTE — Telephone Encounter (Signed)
-----   Message from Vishnu P Mallipeddi sent at 04/06/2024 11:46 AM EDT ----- Normal LV function, normal diastology, normal RV function, mild MR, CVP 3 mmHg.  Overall, normal echocardiogram. ----- Message ----- From: Interface, [employer] Sent: 04/04/2024   2:56 PM EDT To: Vishnu P Mallipeddi, MD

## 2024-04-14 ENCOUNTER — Encounter (HOSPITAL_COMMUNITY): Admission: RE | Admit: 2024-04-14 | Payer: MEDICAID | Source: Ambulatory Visit

## 2024-04-14 ENCOUNTER — Inpatient Hospital Stay (HOSPITAL_COMMUNITY): Admission: RE | Admit: 2024-04-14 | Payer: MEDICAID | Source: Ambulatory Visit

## 2024-05-10 ENCOUNTER — Telehealth: Payer: Self-pay | Admitting: Internal Medicine

## 2024-05-10 NOTE — Telephone Encounter (Signed)
 Pt cancelled and no showed lexiscan several time per Dr. Stacia Can follow up with PCP and return back to cardiology if any new issues,.

## 2024-06-27 ENCOUNTER — Other Ambulatory Visit (HOSPITAL_COMMUNITY): Payer: Self-pay

## 2024-06-27 DIAGNOSIS — R042 Hemoptysis: Secondary | ICD-10-CM

## 2024-06-28 ENCOUNTER — Encounter (HOSPITAL_COMMUNITY): Payer: Self-pay

## 2024-06-28 ENCOUNTER — Other Ambulatory Visit (HOSPITAL_COMMUNITY): Payer: Self-pay

## 2024-06-28 ENCOUNTER — Ambulatory Visit (HOSPITAL_COMMUNITY): Payer: MEDICAID

## 2024-06-28 DIAGNOSIS — R042 Hemoptysis: Secondary | ICD-10-CM

## 2024-07-11 ENCOUNTER — Ambulatory Visit (HOSPITAL_COMMUNITY): Payer: MEDICAID

## 2024-07-25 ENCOUNTER — Ambulatory Visit (HOSPITAL_COMMUNITY)
Admission: RE | Admit: 2024-07-25 | Discharge: 2024-07-25 | Disposition: A | Discharge status: Home / Self Care | Payer: MEDICAID | Source: Ambulatory Visit

## 2024-07-25 DIAGNOSIS — R042 Hemoptysis: Secondary | ICD-10-CM | POA: Insufficient documentation

## 2024-07-25 MED ORDER — IOHEXOL 300 MG/ML  SOLN
75.0000 mL | Freq: Once | INTRAMUSCULAR | Status: AC | PRN
  Administered 2024-07-25: 75 mL via INTRAVENOUS

## 2024-08-16 ENCOUNTER — Ambulatory Visit (HOSPITAL_COMMUNITY): Payer: MEDICAID

## 2024-09-20 ENCOUNTER — Encounter: Payer: MEDICAID | Admitting: Obstetrics & Gynecology

## 2024-10-31 ENCOUNTER — Encounter (HOSPITAL_COMMUNITY): Payer: Self-pay
